# Patient Record
Sex: Female | Born: 1985 | Hispanic: No | Smoking: Never smoker
Health system: Southern US, Community
[De-identification: ages and names within clinical notes are randomized; demographics above are authoritative.]

## PROBLEM LIST (undated history)

## (undated) DIAGNOSIS — N3010 Interstitial cystitis (chronic) without hematuria: Secondary | ICD-10-CM

## (undated) DIAGNOSIS — T7840XA Allergy, unspecified, initial encounter: Secondary | ICD-10-CM

## (undated) DIAGNOSIS — R569 Unspecified convulsions: Secondary | ICD-10-CM

## (undated) DIAGNOSIS — J45909 Unspecified asthma, uncomplicated: Secondary | ICD-10-CM

## (undated) HISTORY — DX: Interstitial cystitis (chronic) without hematuria: N30.10

## (undated) HISTORY — DX: Allergy, unspecified, initial encounter: T78.40XA

## (undated) HISTORY — DX: Unspecified asthma, uncomplicated: J45.909

## (undated) HISTORY — DX: Unspecified convulsions: R56.9

## (undated) HISTORY — PX: WISDOM TOOTH EXTRACTION: SHX21

---
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
## 2004-07-30 ENCOUNTER — Other Ambulatory Visit: Admission: RE | Admit: 2004-07-30 | Discharge: 2004-07-30 | Payer: Self-pay | Admitting: Internal Medicine

## 2005-01-19 ENCOUNTER — Ambulatory Visit: Payer: Self-pay | Admitting: Family Medicine

## 2005-04-11 ENCOUNTER — Emergency Department: Payer: Self-pay | Admitting: Emergency Medicine

## 2005-04-23 ENCOUNTER — Ambulatory Visit: Payer: Self-pay | Admitting: Urology

## 2005-06-17 ENCOUNTER — Ambulatory Visit: Payer: Self-pay | Admitting: Family Medicine

## 2005-06-21 ENCOUNTER — Emergency Department: Payer: Self-pay | Admitting: Emergency Medicine

## 2005-06-23 ENCOUNTER — Emergency Department: Payer: Self-pay | Admitting: Emergency Medicine

## 2005-07-06 ENCOUNTER — Ambulatory Visit: Payer: Self-pay | Admitting: Unknown Physician Specialty

## 2005-07-09 ENCOUNTER — Inpatient Hospital Stay: Payer: Self-pay | Admitting: Unknown Physician Specialty

## 2005-08-26 ENCOUNTER — Ambulatory Visit: Payer: Self-pay | Admitting: Family Medicine

## 2005-11-01 ENCOUNTER — Ambulatory Visit: Payer: Self-pay | Admitting: Family Medicine

## 2005-11-01 ENCOUNTER — Other Ambulatory Visit: Admission: RE | Admit: 2005-11-01 | Discharge: 2005-11-01 | Payer: Self-pay | Admitting: Family Medicine

## 2005-11-29 ENCOUNTER — Ambulatory Visit: Payer: Self-pay | Admitting: Family Medicine

## 2006-01-27 ENCOUNTER — Ambulatory Visit: Payer: Self-pay | Admitting: Family Medicine

## 2008-07-17 ENCOUNTER — Encounter (INDEPENDENT_AMBULATORY_CARE_PROVIDER_SITE_OTHER): Payer: Self-pay | Admitting: Internal Medicine

## 2008-10-14 ENCOUNTER — Ambulatory Visit: Payer: Self-pay | Admitting: Family Medicine

## 2008-11-15 ENCOUNTER — Ambulatory Visit: Payer: Self-pay | Admitting: Family Medicine

## 2008-12-27 HISTORY — PX: LAPAROSCOPY: SHX197

## 2008-12-27 HISTORY — PX: URETEROSCOPY: SHX842

## 2009-02-06 ENCOUNTER — Ambulatory Visit: Payer: Self-pay | Admitting: Unknown Physician Specialty

## 2009-05-13 DIAGNOSIS — N3010 Interstitial cystitis (chronic) without hematuria: Secondary | ICD-10-CM | POA: Insufficient documentation

## 2009-08-26 ENCOUNTER — Emergency Department: Payer: Self-pay | Admitting: Emergency Medicine

## 2009-09-02 ENCOUNTER — Ambulatory Visit: Payer: Self-pay | Admitting: Unknown Physician Specialty

## 2009-09-03 ENCOUNTER — Ambulatory Visit: Payer: Self-pay | Admitting: Unknown Physician Specialty

## 2010-09-04 IMAGING — CR DG CHEST 2V
1 series · 2 of 2 positions shown · non-contrast
Comparison: none

REASON FOR EXAM: cough, influenza
COMMENTS:

PROCEDURE:     KDR - KDXR CHEST PA (OR AP) AND LAT  - October 14, 2008 [DATE]
RESULT:     The lung fields are clear. No pneumonia, pneumothorax or pleural
effusion is seen. The heart, mediastinal and osseous structures show no
significant abnormalities.

[Series 1: view not recorded · 0.17mm/px · 2 of 2 slices shown]
[im 1/2]
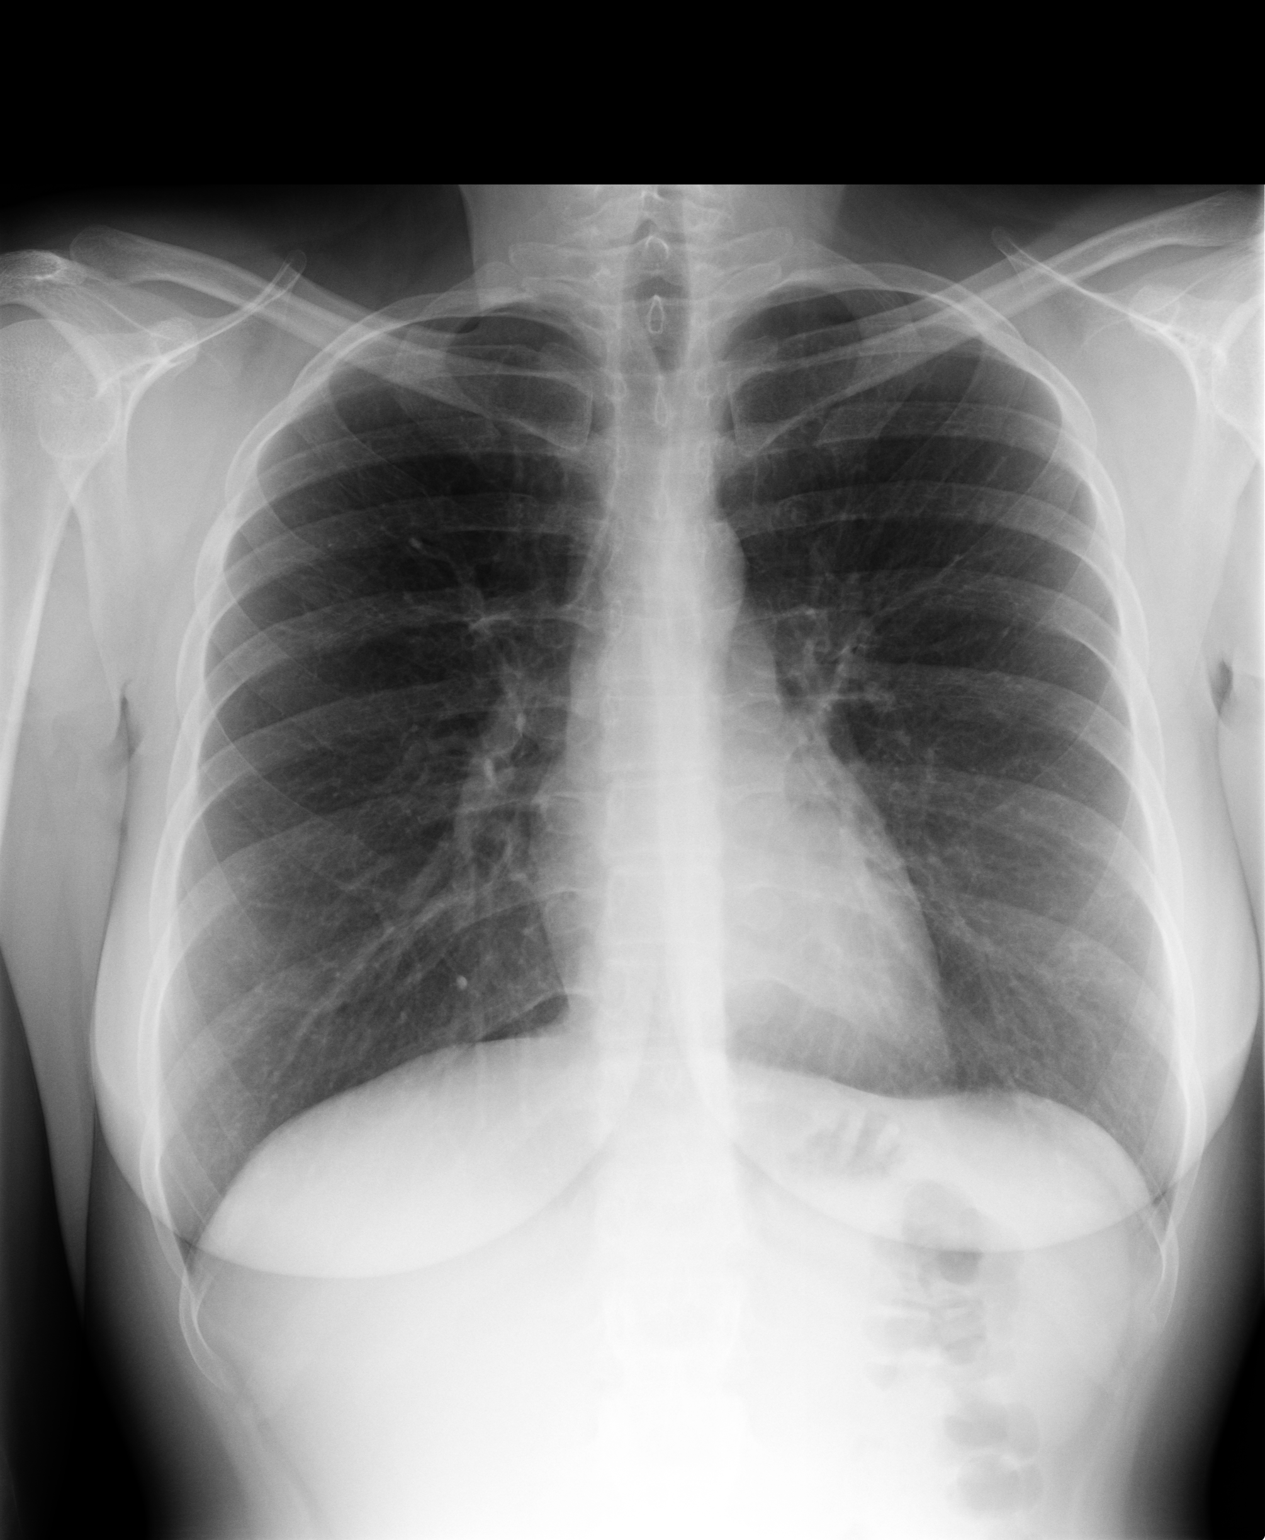
[im 2/2]
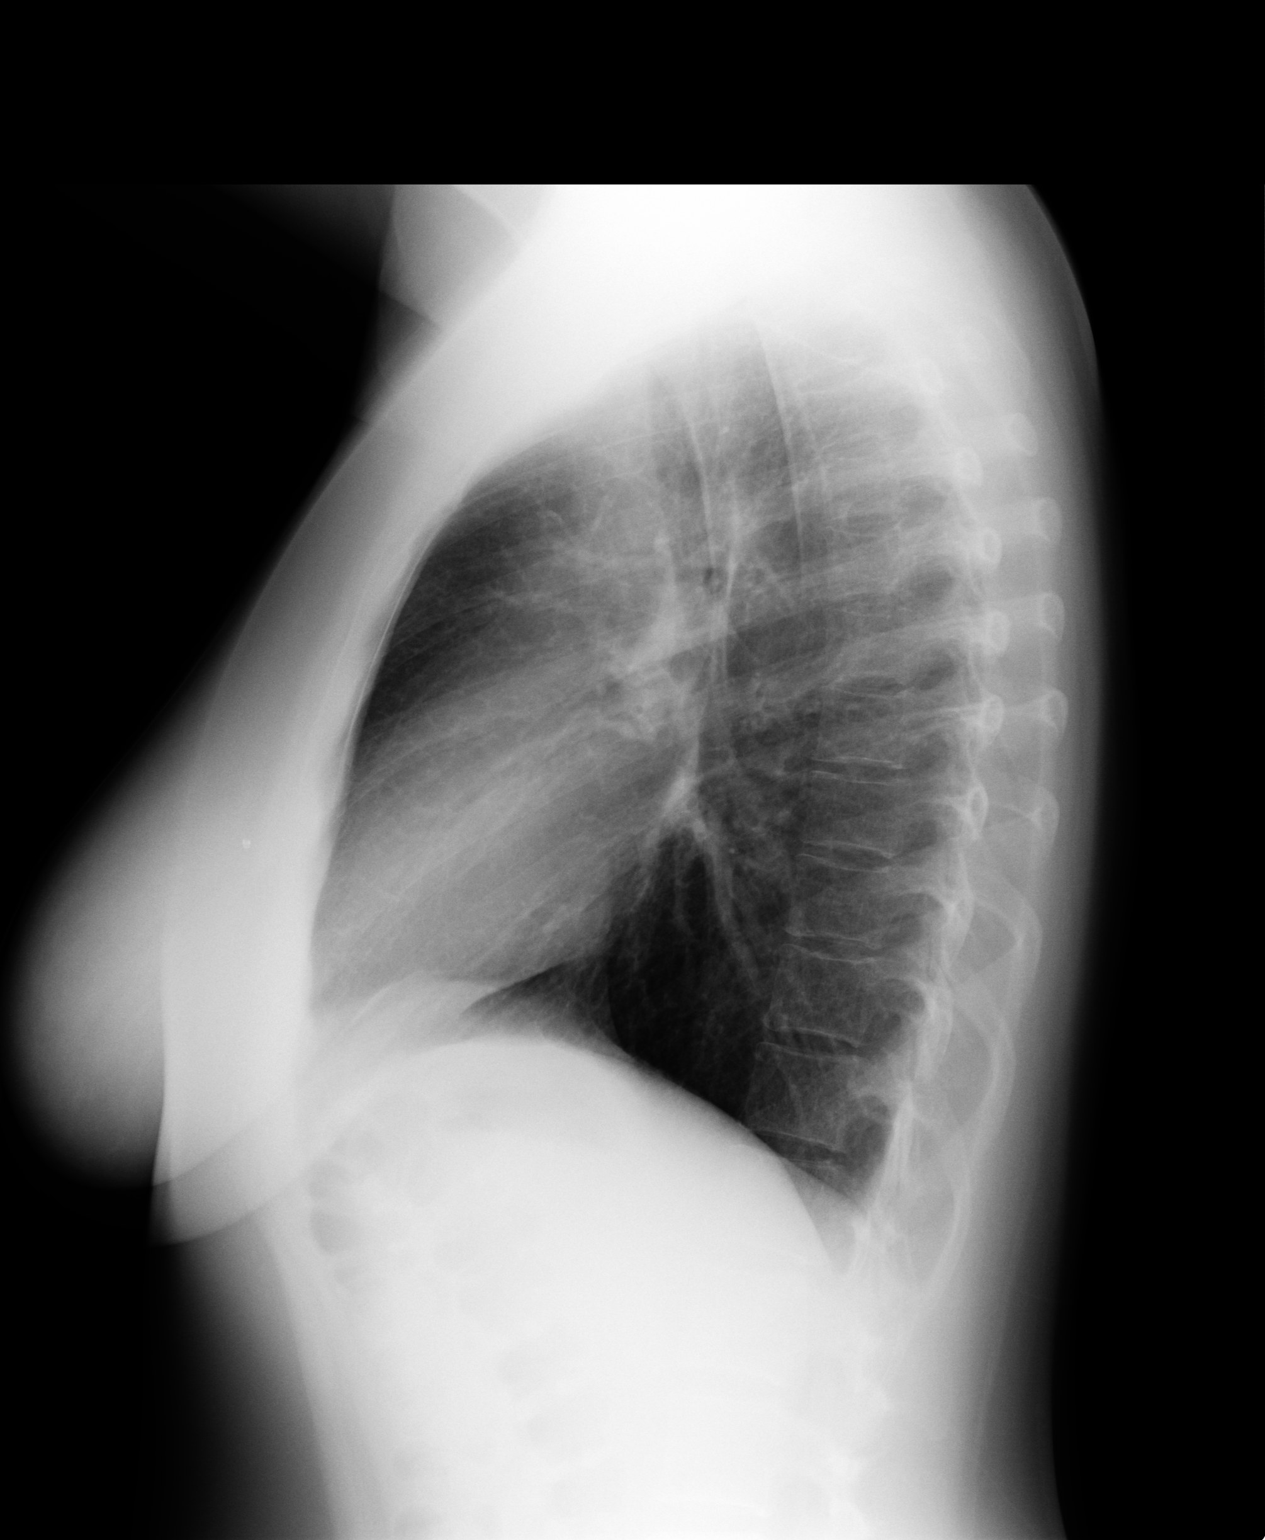

[2 of 2 positions shown; findings below may reference images not displayed]

IMPRESSION: No significant abnormalities are noted.

## 2011-07-17 IMAGING — CT CT ABD-PELV W/O CM
1 of 2 series · 15 of 32 positions shown, 19 images · non-contrast
Comparison: none

REASON FOR EXAM: (1) L flank and LLQ pain; (2) L falnk and LLQ pain,
stone protocol
COMMENTS:

[Series 2: abdomen · axial · 0.67mm/px · z∈[-362,+24]mm · 15 of 85 slices shown, 19 images]
[im 4/85  soft-tissue]
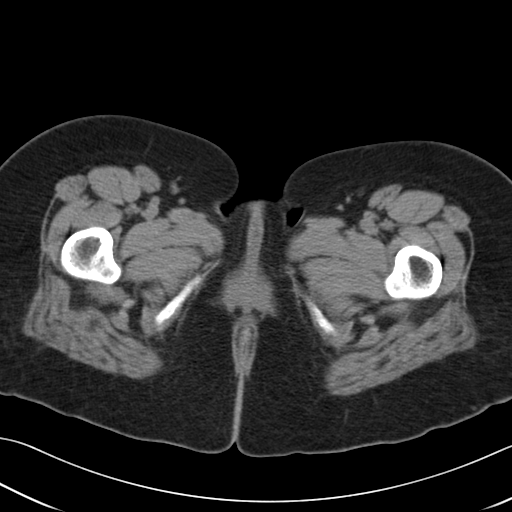
[im 4/85  bone]
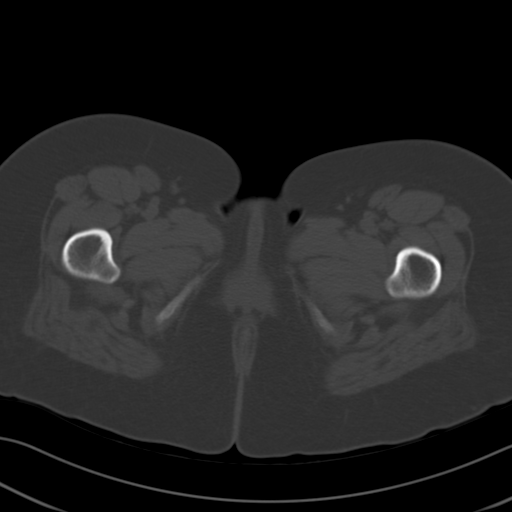
[im 10/85  soft-tissue]
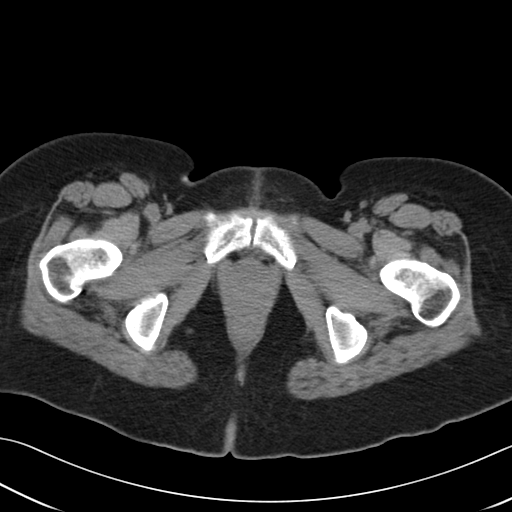
[im 17/85  soft-tissue]
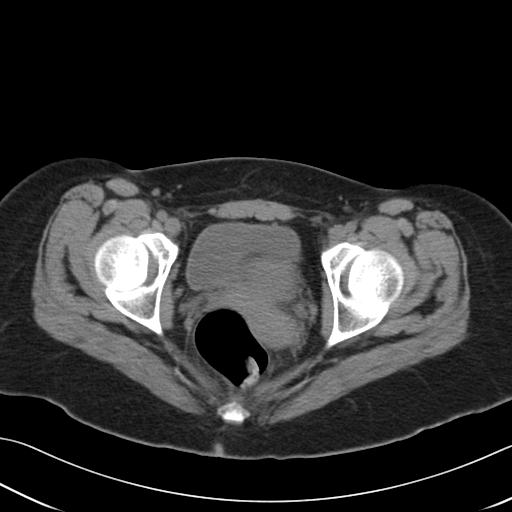
[im 23/85  soft-tissue]
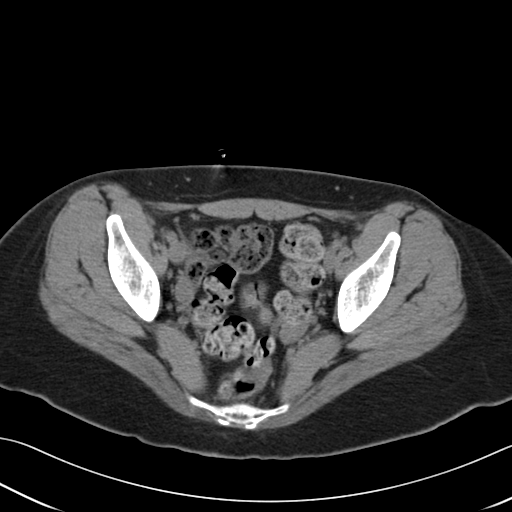
[im 30/85  soft-tissue]
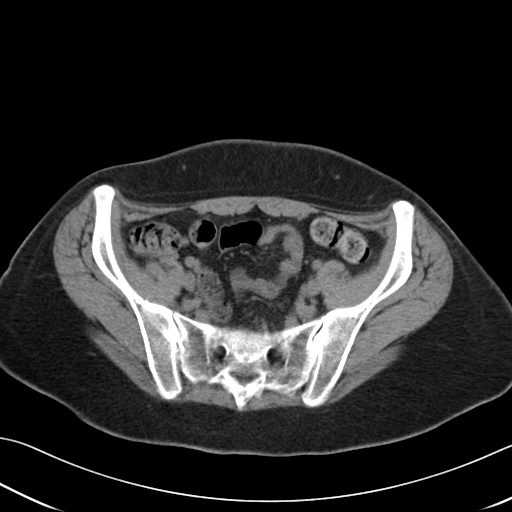
[im 36/85  soft-tissue]
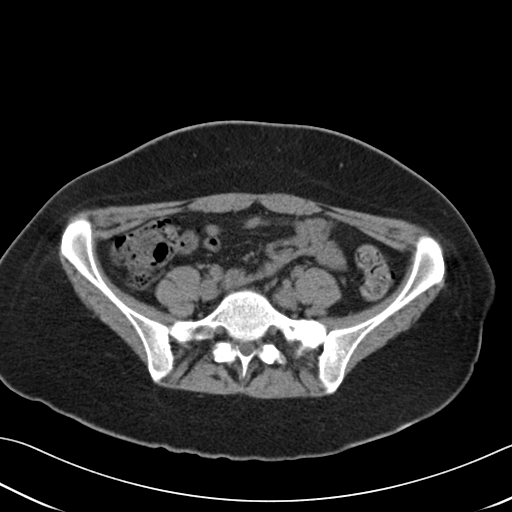
[im 43/85  soft-tissue]
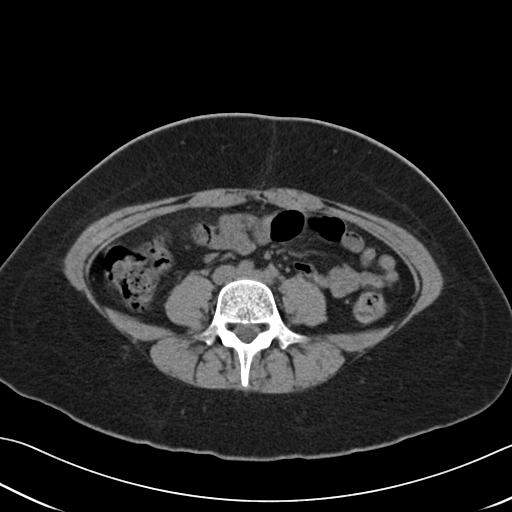
[im 49/85  soft-tissue]
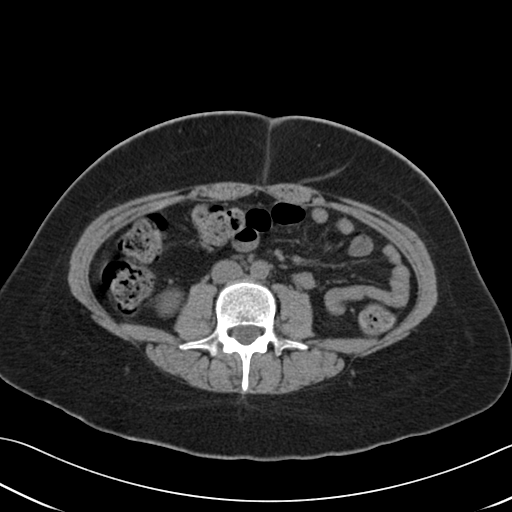
[im 55/85  soft-tissue]
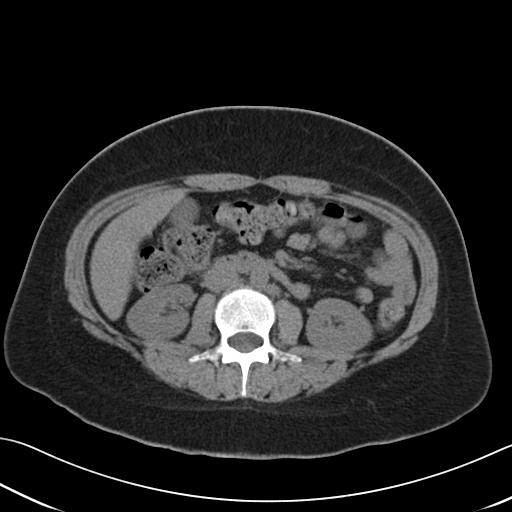
[im 55/85  bone]
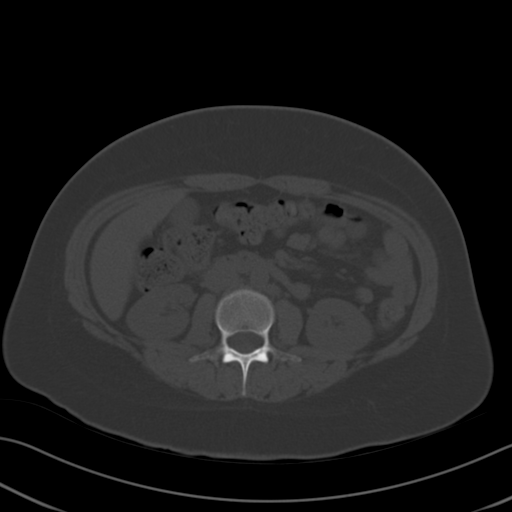
[im 62/85  soft-tissue]
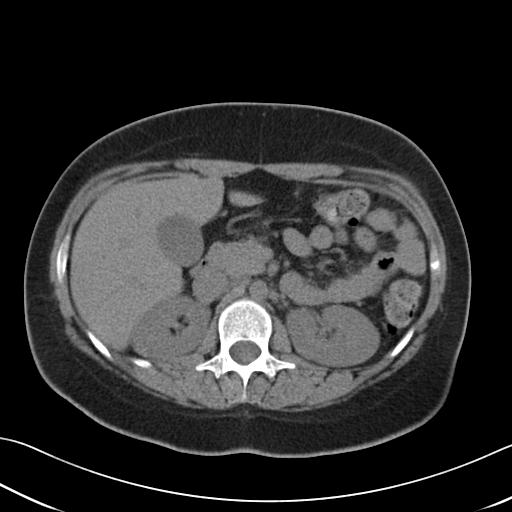
[im 68/85  soft-tissue]
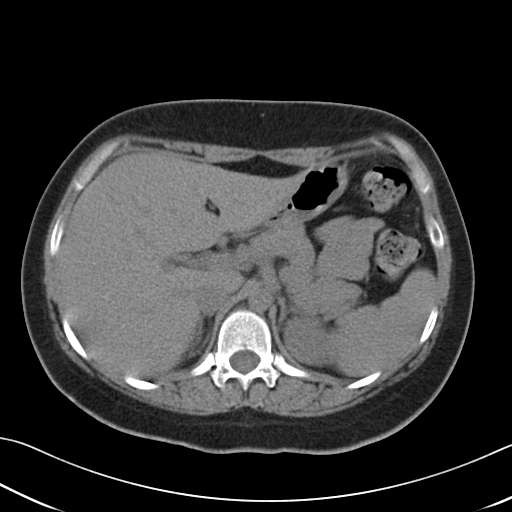
[im 72/85  lung]
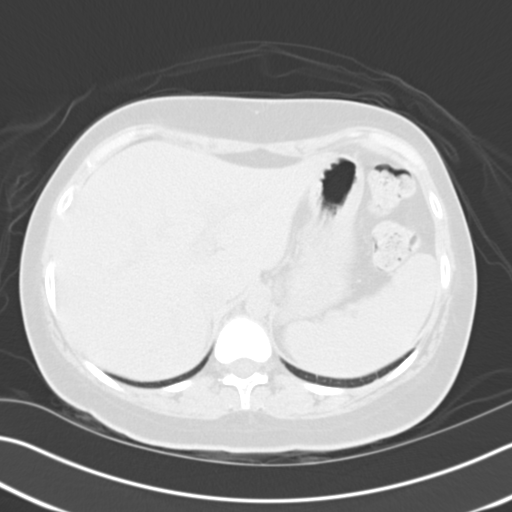
[im 75/85  soft-tissue]
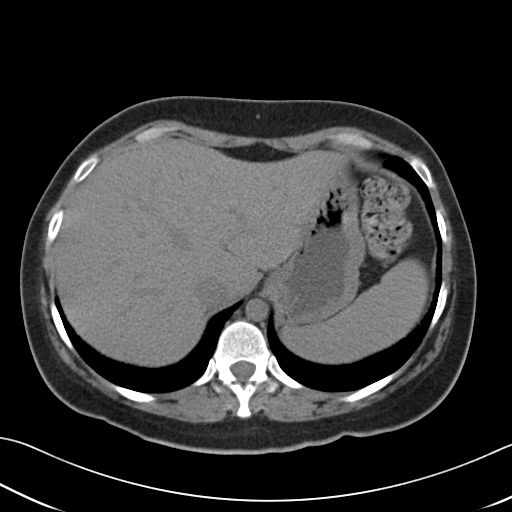
[im 75/85  lung]
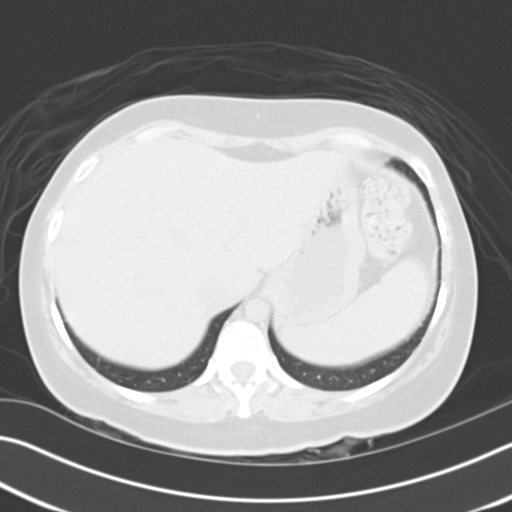
[im 78/85  lung]
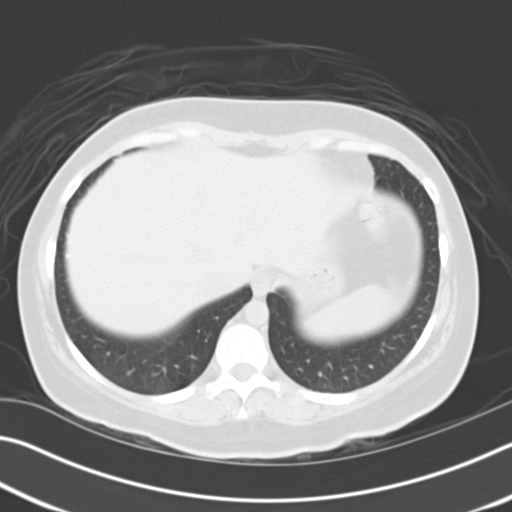
[im 81/85  soft-tissue]
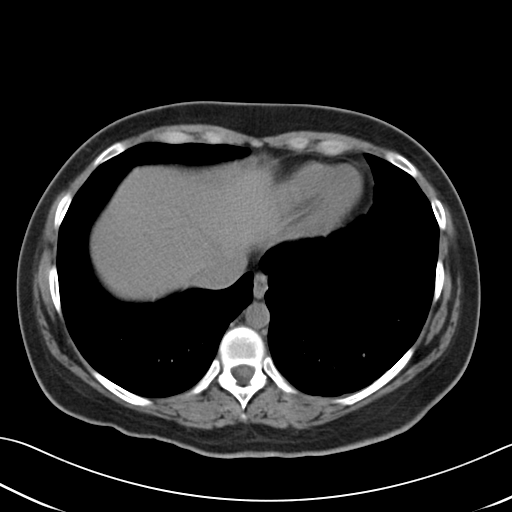
[im 81/85  lung]
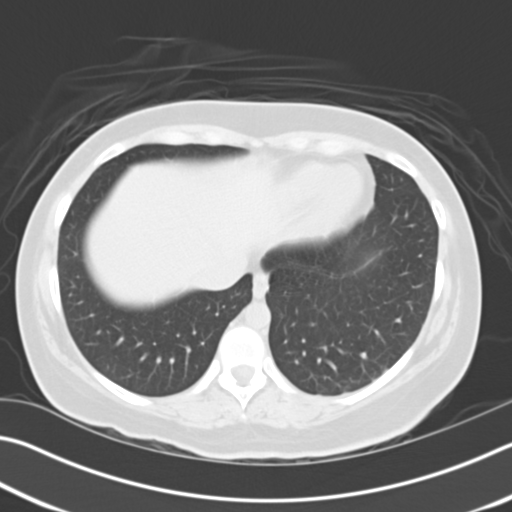

[15 of 32 positions shown; findings below may reference images not displayed]

PROCEDURE:     CT  - CT ABDOMEN AND PELVIS W[DATE]  [DATE]

RESULT:     Noncontrast CT of the abdomen and pelvis is performed utilizing
a renal stone protocol. Images are reconstructed in the axial plane at 3 mm
slice thickness. Comparison is made to the study performed on 02/06/2009.

The lung bases are clear. There is no urolithiasis. There is no urinary
obstruction. The abdominal viscera appear to be within normal limits for a
noncontrast exam. There is no adenopathy or inflammation. Urinary bladder
and uterus appear to be unremarkable. There is no free fluid or abnormal
fluid collection. The bony structures are unremarkable.
IMPRESSION: 1. No acute abnormality. No urinary obstruction or urinary calculi evident.
There is no evidence of an acute inflammatory process. There is no CT
evidence of diverticulitis or appendicitis.

## 2012-04-24 ENCOUNTER — Encounter: Payer: Self-pay | Admitting: Obstetrics & Gynecology

## 2012-04-24 ENCOUNTER — Ambulatory Visit (INDEPENDENT_AMBULATORY_CARE_PROVIDER_SITE_OTHER): Payer: 59 | Admitting: Obstetrics & Gynecology

## 2012-04-24 VITALS — BP 112/69 | HR 61 | Ht 61.0 in | Wt 167.0 lb

## 2012-04-24 DIAGNOSIS — Z3041 Encounter for surveillance of contraceptive pills: Secondary | ICD-10-CM

## 2012-04-24 DIAGNOSIS — Z01419 Encounter for gynecological examination (general) (routine) without abnormal findings: Secondary | ICD-10-CM

## 2012-04-24 DIAGNOSIS — Z124 Encounter for screening for malignant neoplasm of cervix: Secondary | ICD-10-CM

## 2012-04-24 DIAGNOSIS — Z113 Encounter for screening for infections with a predominantly sexual mode of transmission: Secondary | ICD-10-CM

## 2012-04-24 MED ORDER — NORETHIN ACE-ETH ESTRAD-FE 1.5-30 MG-MCG PO TABS: 1.0000 | ORAL_TABLET | Freq: Every day | ORAL | Status: DC

## 2012-04-24 NOTE — Patient Instructions (Signed)
Preventive Care for Adults, Female A healthy lifestyle and preventive care can promote health and wellness. Preventive health guidelines for women include the following key practices.  A routine yearly physical is a good way to check with your caregiver about your health and preventive screening. It is a chance to share any concerns and updates on your health, and to receive a thorough exam.   Visit your dentist for a routine exam and preventive care every 6 months. Brush your teeth twice a day and floss once a day. Good oral hygiene prevents tooth decay and gum disease.   The frequency of eye exams is based on your age, health, family medical history, use of contact lenses, and other factors. Follow your caregiver's recommendations for frequency of eye exams.   Eat a healthy diet. Foods like vegetables, fruits, whole grains, low-fat dairy products, and lean protein foods contain the nutrients you need without too many calories. Decrease your intake of foods high in solid fats, added sugars, and salt. Eat the right amount of calories for you.Get information about a proper diet from your caregiver, if necessary.   Regular physical exercise is one of the most important things you can do for your health. Most adults should get at least 150 minutes of moderate-intensity exercise (any activity that increases your heart rate and causes you to sweat) each week. In addition, most adults need muscle-strengthening exercises on 2 or more days a week.   Maintain a healthy weight. The body mass index (BMI) is a screening tool to identify possible weight problems. It provides an estimate of body fat based on height and weight. Your caregiver can help determine your BMI, and can help you achieve or maintain a healthy weight.For adults 20 years and older:   A BMI below 18.5 is considered underweight.   A BMI of 18.5 to 24.9 is normal.   A BMI of 25 to 29.9 is considered overweight.   A BMI of 30 and above is  considered obese.   Maintain normal blood lipids and cholesterol levels by exercising and minimizing your intake of saturated fat. Eat a balanced diet with plenty of fruit and vegetables. Blood tests for lipids and cholesterol should begin at age 20 and be repeated every 5 years. If your lipid or cholesterol levels are high, you are over 50, or you are at high risk for heart disease, you may need your cholesterol levels checked more frequently.Ongoing high lipid and cholesterol levels should be treated with medicines if diet and exercise are not effective.   If you smoke, find out from your caregiver how to quit. If you do not use tobacco, do not start.   If you are pregnant, do not drink alcohol. If you are breastfeeding, be very cautious about drinking alcohol. If you are not pregnant and choose to drink alcohol, do not exceed 1 drink per day. One drink is considered to be 12 ounces (355 mL) of beer, 5 ounces (148 mL) of wine, or 1.5 ounces (44 mL) of liquor.   Avoid use of street drugs. Do not share needles with anyone. Ask for help if you need support or instructions about stopping the use of drugs.   High blood pressure causes heart disease and increases the risk of stroke. Your blood pressure should be checked at least every 1 to 2 years. Ongoing high blood pressure should be treated with medicines if weight loss and exercise are not effective.   If you are 55 to 27   years old, ask your caregiver if you should take aspirin to prevent strokes.   Diabetes screening involves taking a blood sample to check your fasting blood sugar level. This should be done once every 3 years, after age 45, if you are within normal weight and without risk factors for diabetes. Testing should be considered at a younger age or be carried out more frequently if you are overweight and have at least 1 risk factor for diabetes.   Breast cancer screening is essential preventive care for women. You should practice "breast  self-awareness." This means understanding the normal appearance and feel of your breasts and may include breast self-examination. Any changes detected, no matter how small, should be reported to a caregiver. Women in their 20s and 30s should have a clinical breast exam (CBE) by a caregiver as part of a regular health exam every 1 to 3 years. After age 40, women should have a CBE every year. Starting at age 40, women should consider having a mammography (breast X-ray test) every year. Women who have a family history of breast cancer should talk to their caregiver about genetic screening. Women at a high risk of breast cancer should talk to their caregivers about having magnetic resonance imaging (MRI) and a mammography every year.   The Pap test is a screening test for cervical cancer. A Pap test can show cell changes on the cervix that might become cervical cancer if left untreated. A Pap test is a procedure in which cells are obtained and examined from the lower end of the uterus (cervix).   Women should have a Pap test starting at age 21.   Between ages 21 and 29, Pap tests should be repeated every 2 years.   Beginning at age 30, you should have a Pap test every 3 years as long as the past 3 Pap tests have been normal.   Some women have medical problems that increase the chance of getting cervical cancer. Talk to your caregiver about these problems. It is especially important to talk to your caregiver if a new problem develops soon after your last Pap test. In these cases, your caregiver may recommend more frequent screening and Pap tests.   The above recommendations are the same for women who have or have not gotten the vaccine for human papillomavirus (HPV).   If you had a hysterectomy for a problem that was not cancer or a condition that could lead to cancer, then you no longer need Pap tests. Even if you no longer need a Pap test, a regular exam is a good idea to make sure no other problems are  starting.   If you are between ages 65 and 70, and you have had normal Pap tests going back 10 years, you no longer need Pap tests. Even if you no longer need a Pap test, a regular exam is a good idea to make sure no other problems are starting.   If you have had past treatment for cervical cancer or a condition that could lead to cancer, you need Pap tests and screening for cancer for at least 20 years after your treatment.   If Pap tests have been discontinued, risk factors (such as a new sexual partner) need to be reassessed to determine if screening should be resumed.   The HPV test is an additional test that may be used for cervical cancer screening. The HPV test looks for the virus that can cause the cell changes on the cervix.   The cells collected during the Pap test can be tested for HPV. The HPV test could be used to screen women aged 30 years and older, and should be used in women of any age who have unclear Pap test results. After the age of 30, women should have HPV testing at the same frequency as a Pap test.   Colorectal cancer can be detected and often prevented. Most routine colorectal cancer screening begins at the age of 50 and continues through age 75. However, your caregiver may recommend screening at an earlier age if you have risk factors for colon cancer. On a yearly basis, your caregiver may provide home test kits to check for hidden blood in the stool. Use of a small camera at the end of a tube, to directly examine the colon (sigmoidoscopy or colonoscopy), can detect the earliest forms of colorectal cancer. Talk to your caregiver about this at age 50, when routine screening begins. Direct examination of the colon should be repeated every 5 to 10 years through age 75, unless early forms of pre-cancerous polyps or small growths are found.   Hepatitis C blood testing is recommended for all people born from 1945 through 1965 and any individual with known risks for hepatitis C.    Practice safe sex. Use condoms and avoid high-risk sexual practices to reduce the spread of sexually transmitted infections (STIs). STIs include gonorrhea, chlamydia, syphilis, trichomonas, herpes, HPV, and human immunodeficiency virus (HIV). Herpes, HIV, and HPV are viral illnesses that have no cure. They can result in disability, cancer, and death. Sexually active women aged 25 and younger should be checked for chlamydia. Older women with new or multiple partners should also be tested for chlamydia. Testing for other STIs is recommended if you are sexually active and at increased risk.   Osteoporosis is a disease in which the bones lose minerals and strength with aging. This can result in serious bone fractures. The risk of osteoporosis can be identified using a bone density scan. Women ages 65 and over and women at risk for fractures or osteoporosis should discuss screening with their caregivers. Ask your caregiver whether you should take a calcium supplement or vitamin D to reduce the rate of osteoporosis.   Menopause can be associated with physical symptoms and risks. Hormone replacement therapy is available to decrease symptoms and risks. You should talk to your caregiver about whether hormone replacement therapy is right for you.   Use sunscreen with sun protection factor (SPF) of 30 or more. Apply sunscreen liberally and repeatedly throughout the day. You should seek shade when your shadow is shorter than you. Protect yourself by wearing long sleeves, pants, a wide-brimmed hat, and sunglasses year round, whenever you are outdoors.   Once a month, do a whole body skin exam, using a mirror to look at the skin on your back. Notify your caregiver of new moles, moles that have irregular borders, moles that are larger than a pencil eraser, or moles that have changed in shape or color.   Stay current with required immunizations.   Influenza. You need a dose every fall (or winter). The composition of  the flu vaccine changes each year, so being vaccinated once is not enough.   Pneumococcal polysaccharide. You need 1 to 2 doses if you smoke cigarettes or if you have certain chronic medical conditions. You need 1 dose at age 65 (or older) if you have never been vaccinated.   Tetanus, diphtheria, pertussis (Tdap, Td). Get 1 dose of   Tdap vaccine if you are younger than age 65, are over 65 and have contact with an infant, are a healthcare worker, are pregnant, or simply want to be protected from whooping cough. After that, you need a Td booster dose every 10 years. Consult your caregiver if you have not had at least 3 tetanus and diphtheria-containing shots sometime in your life or have a deep or dirty wound.   HPV. You need this vaccine if you are a woman age 26 or younger. The vaccine is given in 3 doses over 6 months.   Measles, mumps, rubella (MMR). You need at least 1 dose of MMR if you were born in 1957 or later. You may also need a second dose.   Meningococcal. If you are age 19 to 21 and a first-year college student living in a residence hall, or have one of several medical conditions, you need to get vaccinated against meningococcal disease. You may also need additional booster doses.   Zoster (shingles). If you are age 60 or older, you should get this vaccine.   Varicella (chickenpox). If you have never had chickenpox or you were vaccinated but received only 1 dose, talk to your caregiver to find out if you need this vaccine.   Hepatitis A. You need this vaccine if you have a specific risk factor for hepatitis A virus infection or you simply wish to be protected from this disease. The vaccine is usually given as 2 doses, 6 to 18 months apart.   Hepatitis B. You need this vaccine if you have a specific risk factor for hepatitis B virus infection or you simply wish to be protected from this disease. The vaccine is given in 3 doses, usually over 6 months.  Preventive Services /  Frequency Ages 19 to 39  Blood pressure check.** / Every 1 to 2 years.   Lipid and cholesterol check.** / Every 5 years beginning at age 20.   Clinical breast exam.** / Every 3 years for women in their 20s and 30s.   Pap test.** / Every 2 years from ages 21 through 29. Every 3 years starting at age 30 through age 65 or 70 with a history of 3 consecutive normal Pap tests.   HPV screening.** / Every 3 years from ages 30 through ages 65 to 70 with a history of 3 consecutive normal Pap tests.   Hepatitis C blood test.** / For any individual with known risks for hepatitis C.   Skin self-exam. / Monthly.   Influenza immunization.** / Every year.   Pneumococcal polysaccharide immunization.** / 1 to 2 doses if you smoke cigarettes or if you have certain chronic medical conditions.   Tetanus, diphtheria, pertussis (Tdap, Td) immunization. / A one-time dose of Tdap vaccine. After that, you need a Td booster dose every 10 years.   HPV immunization. / 3 doses over 6 months, if you are 26 and younger.   Measles, mumps, rubella (MMR) immunization. / You need at least 1 dose of MMR if you were born in 1957 or later. You may also need a second dose.   Meningococcal immunization. / 1 dose if you are age 19 to 21 and a first-year college student living in a residence hall, or have one of several medical conditions, you need to get vaccinated against meningococcal disease. You may also need additional booster doses.   Varicella immunization.** / Consult your caregiver.   Hepatitis A immunization.** / Consult your caregiver. 2 doses, 6 to 18 months   apart.   Hepatitis B immunization.** / Consult your caregiver. 3 doses usually over 6 months.  Ages 40 to 64  Blood pressure check.** / Every 1 to 2 years.   Lipid and cholesterol check.** / Every 5 years beginning at age 20.   Clinical breast exam.** / Every year after age 40.   Mammogram.** / Every year beginning at age 40 and continuing for as  long as you are in good health. Consult with your caregiver.   Pap test.** / Every 3 years starting at age 30 through age 65 or 70 with a history of 3 consecutive normal Pap tests.   HPV screening.** / Every 3 years from ages 30 through ages 65 to 70 with a history of 3 consecutive normal Pap tests.   Fecal occult blood test (FOBT) of stool. / Every year beginning at age 50 and continuing until age 75. You may not need to do this test if you get a colonoscopy every 10 years.   Flexible sigmoidoscopy or colonoscopy.** / Every 5 years for a flexible sigmoidoscopy or every 10 years for a colonoscopy beginning at age 50 and continuing until age 75.   Hepatitis C blood test.** / For all people born from 1945 through 1965 and any individual with known risks for hepatitis C.   Skin self-exam. / Monthly.   Influenza immunization.** / Every year.   Pneumococcal polysaccharide immunization.** / 1 to 2 doses if you smoke cigarettes or if you have certain chronic medical conditions.   Tetanus, diphtheria, pertussis (Tdap, Td) immunization.** / A one-time dose of Tdap vaccine. After that, you need a Td booster dose every 10 years.   Measles, mumps, rubella (MMR) immunization. / You need at least 1 dose of MMR if you were born in 1957 or later. You may also need a second dose.   Varicella immunization.** / Consult your caregiver.   Meningococcal immunization.** / Consult your caregiver.   Hepatitis A immunization.** / Consult your caregiver. 2 doses, 6 to 18 months apart.   Hepatitis B immunization.** / Consult your caregiver. 3 doses, usually over 6 months.  Ages 65 and over  Blood pressure check.** / Every 1 to 2 years.   Lipid and cholesterol check.** / Every 5 years beginning at age 20.   Clinical breast exam.** / Every year after age 40.   Mammogram.** / Every year beginning at age 40 and continuing for as long as you are in good health. Consult with your caregiver.   Pap test.** /  Every 3 years starting at age 30 through age 65 or 70 with a 3 consecutive normal Pap tests. Testing can be stopped between 65 and 70 with 3 consecutive normal Pap tests and no abnormal Pap or HPV tests in the past 10 years.   HPV screening.** / Every 3 years from ages 30 through ages 65 or 70 with a history of 3 consecutive normal Pap tests. Testing can be stopped between 65 and 70 with 3 consecutive normal Pap tests and no abnormal Pap or HPV tests in the past 10 years.   Fecal occult blood test (FOBT) of stool. / Every year beginning at age 50 and continuing until age 75. You may not need to do this test if you get a colonoscopy every 10 years.   Flexible sigmoidoscopy or colonoscopy.** / Every 5 years for a flexible sigmoidoscopy or every 10 years for a colonoscopy beginning at age 50 and continuing until age 75.   Hepatitis   C blood test.** / For all people born from 1945 through 1965 and any individual with known risks for hepatitis C.   Osteoporosis screening.** / A one-time screening for women ages 65 and over and women at risk for fractures or osteoporosis.   Skin self-exam. / Monthly.   Influenza immunization.** / Every year.   Pneumococcal polysaccharide immunization.** / 1 dose at age 65 (or older) if you have never been vaccinated.   Tetanus, diphtheria, pertussis (Tdap, Td) immunization. / A one-time dose of Tdap vaccine if you are over 65 and have contact with an infant, are a healthcare worker, or simply want to be protected from whooping cough. After that, you need a Td booster dose every 10 years.   Varicella immunization.** / Consult your caregiver.   Meningococcal immunization.** / Consult your caregiver.   Hepatitis A immunization.** / Consult your caregiver. 2 doses, 6 to 18 months apart.   Hepatitis B immunization.** / Check with your caregiver. 3 doses, usually over 6 months.  ** Family history and personal history of risk and conditions may change your caregiver's  recommendations. Document Released: 02/08/2002 Document Revised: 12/02/2011 Document Reviewed: 05/10/2011 [employer] Patient Information 2012 [employer]. 

## 2012-04-24 NOTE — Progress Notes (Signed)
  Subjective:     Pamela Dillon is a 26 y.o. G0P0 female and is here for annual gynecologic exam. The patient reports no problems. 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 No other problems.  History   Social History  . Marital Status: Single    Spouse Name: N/A    Number of Children: N/A  . Years of Education: N/A   Occupational History  . Not on file.   Social History Main Topics  . Smoking status: Never Smoker   . Smokeless tobacco: Not on file  . Alcohol Use: Yes     occassionally  . Drug Use: No  . Sexually Active: Yes -- Female partner(s)    Birth Control/ Protection: Pill   Other Topics Concern  . Not on file   Social History Narrative  . No narrative on file   Health Maintenance  Topic Date Due  . Pap Smear  02/09/2003  . Tetanus/tdap  02/10/2004  . Influenza Vaccine  09/26/2012   The following portions of the patient's history were reviewed and updated as appropriate: allergies, current medications, past family history, past medical history, past social history, past surgical history and problem list.  Review of Systems A comprehensive review of systems was negative.   Objective:  Blood pressure 112/69, pulse 61, height 5\' 1"  (1.549 m), weight 167 lb (75.751 kg), last menstrual period 03/27/2012. GENERAL: Well-developed, well-nourished female in no acute distress.  HEENT: Normocephalic, atraumatic. Sclerae anicteric.  NECK: Supple. Normal thyroid.  LUNGS: Clear to auscultation bilaterally.  HEART: Regular rate and rhythm. BREASTS: Symmetric with everted nipples. No masses, skin changes, nipple drainage, or lymphadenopathy. ABDOMEN: Soft, nontender, nondistended. No organomegaly. PELVIC: Normal external female genitalia. Vagina is pink and rugated.  Normal discharge. Normal cervix contour. Pap smear obtained. Uterus is normal in size. No adnexal mass or tenderness.  EXTREMITIES: No cyanosis, clubbing,  or edema, 2+ distal pulses.   Assessment:    Healthy female exam.      Plan:    Pap done, will follow up results OCPs refilled Patient is on medication for Elmiron for interstitial cystitis; will call us with dosage and we will call in a refill

## 2012-05-04 ENCOUNTER — Other Ambulatory Visit: Payer: Self-pay | Admitting: Sports Medicine

## 2012-05-04 DIAGNOSIS — M25539 Pain in unspecified wrist: Secondary | ICD-10-CM

## 2012-05-04 DIAGNOSIS — M25531 Pain in right wrist: Secondary | ICD-10-CM

## 2012-05-10 ENCOUNTER — Inpatient Hospital Stay: Admission: RE | Admit: 2012-05-10 | Payer: 59 | Source: Ambulatory Visit

## 2012-05-10 ENCOUNTER — Other Ambulatory Visit: Payer: 59

## 2012-05-14 ENCOUNTER — Ambulatory Visit
Admission: RE | Admit: 2012-05-14 | Discharge: 2012-05-14 | Disposition: A | Discharge status: Home / Self Care | Payer: 59 | Source: Ambulatory Visit | Attending: Sports Medicine | Admitting: Sports Medicine

## 2012-05-14 DIAGNOSIS — M25531 Pain in right wrist: Secondary | ICD-10-CM

## 2012-05-18 ENCOUNTER — Other Ambulatory Visit: Payer: 59

## 2015-05-01 ENCOUNTER — Other Ambulatory Visit: Payer: Self-pay | Admitting: *Deleted

## 2015-05-15 ENCOUNTER — Other Ambulatory Visit: Payer: Self-pay | Admitting: Internal Medicine

## 2015-05-15 ENCOUNTER — Encounter: Payer: Self-pay | Admitting: Internal Medicine

## 2015-05-15 ENCOUNTER — Ambulatory Visit (INDEPENDENT_AMBULATORY_CARE_PROVIDER_SITE_OTHER): Payer: BLUE CROSS/BLUE SHIELD | Admitting: Internal Medicine

## 2015-05-15 VITALS — BP 110/70 | HR 66 | Temp 98.1°F | Ht 61.0 in | Wt 179.0 lb

## 2015-05-15 DIAGNOSIS — J45909 Unspecified asthma, uncomplicated: Secondary | ICD-10-CM | POA: Insufficient documentation

## 2015-05-15 DIAGNOSIS — J4540 Moderate persistent asthma, uncomplicated: Secondary | ICD-10-CM

## 2015-05-15 DIAGNOSIS — R0602 Shortness of breath: Secondary | ICD-10-CM

## 2015-05-15 DIAGNOSIS — R0600 Dyspnea, unspecified: Secondary | ICD-10-CM

## 2015-05-15 NOTE — Patient Instructions (Addendum)
Follow up with Dr. Stevenson Clinch in 1 month - methacholine challenge test, pulmonary function testing and 6 minute walk test at [facility] prior to follow up visit - cont with Symbicort - 2 puff in the AM and PM - gargle and rinse each use - albuterol inhaler - 2puff every 3-4 hours as needed for shortness of breath\wheezing\recurrent cough - possible triggers of your asthma - rapid weather changes, dust, pollen, saw dust, etc. - try at best to avoid triggers, wear a mask while at work.   Asthma Asthma is a recurring condition in which the airways tighten and narrow. Asthma can make it difficult to breathe. It can cause coughing, wheezing, and shortness of breath. Asthma episodes, also called asthma attacks, range from minor to life-threatening. Asthma cannot be cured, but medicines and lifestyle changes can help control it. CAUSES Asthma is believed to be caused by inherited (genetic) and environmental factors, but its exact cause is unknown. Asthma may be triggered by allergens, lung infections, or irritants in the air. Asthma triggers are different for each person. Common triggers include:   Animal dander.  Dust mites.  Cockroaches.  Pollen from trees or grass.  Mold.  Smoke.  Air pollutants such as dust, household cleaners, hair sprays, aerosol sprays, paint fumes, strong chemicals, or strong odors.  Cold air, weather changes, and winds (which increase molds and pollens in the air).  Strong emotional expressions such as crying or laughing hard.  Stress.  Certain medicines (such as aspirin) or types of drugs (such as beta-blockers).  Sulfites in foods and drinks. Foods and drinks that may contain sulfites include dried fruit, potato chips, and sparkling grape juice.  Infections or inflammatory conditions such as the flu, a cold, or an inflammation of the nasal membranes (rhinitis).  Gastroesophageal reflux disease (GERD).  Exercise or strenuous activity. SYMPTOMS Symptoms may occur  immediately after asthma is triggered or many hours later. Symptoms include:  Wheezing.  Excessive nighttime or early morning coughing.  Frequent or severe coughing with a common cold.  Chest tightness.  Shortness of breath. DIAGNOSIS  The diagnosis of asthma is made by a review of your medical history and a physical exam. Tests may also be performed. These may include:  Lung function studies. These tests show how much air you breathe in and out.  Allergy tests.  Imaging tests such as X-rays. TREATMENT  Asthma cannot be cured, but it can usually be controlled. Treatment involves identifying and avoiding your asthma triggers. It also involves medicines. There are 2 classes of medicine used for asthma treatment:   Controller medicines. These prevent asthma symptoms from occurring. They are usually taken every day.  Reliever or rescue medicines. These quickly relieve asthma symptoms. They are used as needed and provide short-term relief. Your health care provider will help you create an asthma action plan. An asthma action plan is a written plan for managing and treating your asthma attacks. It includes a list of your asthma triggers and how they may be avoided. It also includes information on when medicines should be taken and when their dosage should be changed. An action plan may also involve the use of a device called a peak flow meter. A peak flow meter measures how well the lungs are working. It helps you monitor your condition. HOME CARE INSTRUCTIONS   Take medicines only as directed by your health care provider. Speak with your health care provider if you have questions about how or when to take the medicines.  Use a peak flow meter as directed by your health care provider. Record and keep track of readings.  Understand and use the action plan to help minimize or stop an asthma attack without needing to seek medical care.  Control your home environment in the following ways to  help prevent asthma attacks:  Do not smoke. Avoid being exposed to secondhand smoke.  Change your heating and air conditioning filter regularly.  Limit your use of fireplaces and wood stoves.  Get rid of pests (such as roaches and mice) and their droppings.  Throw away plants if you see mold on them.  Clean your floors and dust regularly. Use unscented cleaning products.  Try to have someone else vacuum for you regularly. Stay out of rooms while they are being vacuumed and for a short while afterward. If you vacuum, use a dust mask from a hardware store, a double-layered or microfilter vacuum cleaner bag, or a vacuum cleaner with a HEPA filter.  Replace carpet with wood, tile, or vinyl flooring. Carpet can trap dander and dust.  Use allergy-proof pillows, mattress covers, and box spring covers.  Wash bed sheets and blankets every week in hot water and dry them in a dryer.  Use blankets that are made of polyester or cotton.  Clean bathrooms and kitchens with bleach. If possible, have someone repaint the walls in these rooms with mold-resistant paint. Keep out of the rooms that are being cleaned and painted.  Wash hands frequently. SEEK MEDICAL CARE IF:   You have wheezing, shortness of breath, or a cough even if taking medicine to prevent attacks.  The colored mucus you cough up (sputum) is thicker than usual.  Your sputum changes from clear or white to yellow, green, gray, or bloody.  You have any problems that may be related to the medicines you are taking (such as a rash, itching, swelling, or trouble breathing).  You are using a reliever medicine more than 2-3 times per week.  Your peak flow is still at 50-79% of your personal best after following your action plan for 1 hour.  You have a fever. SEEK IMMEDIATE MEDICAL CARE IF:   You seem to be getting worse and are unresponsive to treatment during an asthma attack.  You are short of breath even at rest.  You get  short of breath when doing very little physical activity.  You have difficulty eating, drinking, or talking due to asthma symptoms.  You develop chest pain.  You develop a fast heartbeat.  You have a bluish color to your lips or fingernails.  You are light-headed, dizzy, or faint.  Your peak flow is less than 50% of your personal best. MAKE SURE YOU:   Understand these instructions.  Will watch your condition.  Will get help right away if you are not doing well or get worse. Document Released: 12/13/2005 Document Revised: 04/29/2014 Document Reviewed: 07/12/2013 [facility] Patient Information 2015 [place], [place]. This information is not intended to replace advice given to you by your health care provider. Make sure you discuss any questions you have with your health care provider.

## 2015-05-20 ENCOUNTER — Ambulatory Visit: Payer: BLUE CROSS/BLUE SHIELD | Attending: Internal Medicine

## 2015-05-20 ENCOUNTER — Other Ambulatory Visit: Payer: BLUE CROSS/BLUE SHIELD | Admitting: Internal Medicine

## 2015-05-20 ENCOUNTER — Ambulatory Visit: Payer: BLUE CROSS/BLUE SHIELD

## 2015-05-20 DIAGNOSIS — J4540 Moderate persistent asthma, uncomplicated: Secondary | ICD-10-CM | POA: Diagnosis present

## 2015-05-20 DIAGNOSIS — R0600 Dyspnea, unspecified: Secondary | ICD-10-CM | POA: Diagnosis present

## 2015-05-20 DIAGNOSIS — R0602 Shortness of breath: Secondary | ICD-10-CM

## 2015-05-20 LAB — PULMONARY FUNCTION TEST

## 2015-05-20 MED ORDER — METHACHOLINE 16 MG/ML NEB SOLN: 2.0000 mL | Freq: Once | RESPIRATORY_TRACT | Status: DC

## 2015-05-20 MED ORDER — METHACHOLINE 0.0625 MG/ML NEB SOLN: 2.0000 mL | Freq: Once | RESPIRATORY_TRACT | Status: DC

## 2015-05-20 MED ORDER — METHACHOLINE 0.25 MG/ML NEB SOLN: 2.0000 mL | Freq: Once | RESPIRATORY_TRACT | Status: DC

## 2015-05-20 MED ORDER — SODIUM CHLORIDE 0.9 % IN NEBU
3.0000 mL | INHALATION_SOLUTION | Freq: Once | RESPIRATORY_TRACT | Status: AC
  Administered 2015-05-20: 3 mL via RESPIRATORY_TRACT

## 2015-05-20 MED ORDER — METHACHOLINE 4 MG/ML NEB SOLN
2.0000 mL | Freq: Once | RESPIRATORY_TRACT | Status: AC
  Filled 2015-05-20: qty 2

## 2015-05-20 MED ORDER — METHACHOLINE 0.0625 MG/ML NEB SOLN
2.0000 mL | Freq: Once | RESPIRATORY_TRACT | Status: AC
Start: 2015-05-20 — End: 2015-05-20
  Administered 2015-05-20: 0.125 mg via RESPIRATORY_TRACT
  Filled 2015-05-20: qty 2

## 2015-05-20 MED ORDER — METHACHOLINE 1 MG/ML NEB SOLN: 2.0000 mL | Freq: Once | RESPIRATORY_TRACT | Status: DC

## 2015-05-20 MED ORDER — SODIUM CHLORIDE 0.9 % IN NEBU: 3.0000 mL | INHALATION_SOLUTION | Freq: Once | RESPIRATORY_TRACT | Status: DC

## 2015-05-20 MED ORDER — METHACHOLINE 1 MG/ML NEB SOLN
2.0000 mL | Freq: Once | RESPIRATORY_TRACT | Status: AC
  Filled 2015-05-20: qty 2

## 2015-05-20 MED ORDER — METHACHOLINE 16 MG/ML NEB SOLN
2.0000 mL | Freq: Once | RESPIRATORY_TRACT | Status: AC
  Filled 2015-05-20: qty 2

## 2015-05-20 MED ORDER — ALBUTEROL SULFATE (2.5 MG/3ML) 0.083% IN NEBU: 2.5000 mg | INHALATION_SOLUTION | Freq: Once | RESPIRATORY_TRACT | Status: DC

## 2015-05-20 MED ORDER — ALBUTEROL SULFATE (2.5 MG/3ML) 0.083% IN NEBU
2.5000 mg | INHALATION_SOLUTION | Freq: Once | RESPIRATORY_TRACT | Status: AC
  Administered 2015-05-20: 2.5 mg via RESPIRATORY_TRACT
  Filled 2015-05-20: qty 3

## 2015-05-20 MED ORDER — METHACHOLINE 0.25 MG/ML NEB SOLN
2.0000 mL | Freq: Once | RESPIRATORY_TRACT | Status: AC
  Administered 2015-05-20: 0.5 mg via RESPIRATORY_TRACT
  Filled 2015-05-20: qty 2

## 2015-05-20 MED ORDER — METHACHOLINE 4 MG/ML NEB SOLN: 2.0000 mL | Freq: Once | RESPIRATORY_TRACT | Status: DC

## 2015-05-21 ENCOUNTER — Telehealth: Payer: Self-pay | Admitting: Internal Medicine

## 2015-05-21 NOTE — Telephone Encounter (Signed)
Called and spoke with Patient, she had methacholine challenge testing done yesterday.  This morning noted with sob, and chest tightness, called the pulm office, and was advised to take neb tx.  Has had a total of 3 neb tx today, advised patient to rest and may use neb later if needed. Also, advised to avoid any NSAIDs over the next 24-48 hrs.  There is a high clinical suspicion for asthma   - Dr. Stevenson Clinch

## 2015-05-21 NOTE — Telephone Encounter (Signed)
Spoke with pt's mother Pamela Dillon, states she had PFT at hospital yesterday, today is sob, gasping for air, wheezing.  Pt has used rescue inhaler and nebulizer early this morning which has not helped.  Pt's mother states she wants to take pt to ED to see VM.  I advised that VM is not in the hospital today (schedule says 3pm Elink), and advised to do one more neb tx since it was done so early this morning and that if this did not help to go to the ED as she plans.  Pt's mother understands.     Forwarding to VM just as FYI.

## 2015-05-22 NOTE — Telephone Encounter (Signed)
Vilinda Boehringer, MD at 05/21/2015 3:10 PM     Status: Signed       Expand All Collapse All   Called and spoke with Patient, she had methacholine challenge testing done yesterday. This morning noted with sob, and chest tightness, called the pulm office, and was advised to take neb tx. Has had a total of 3 neb tx today, advised patient to rest and may use neb later if needed. Also, advised to avoid any NSAIDs over the next 24-48 hrs.  There is a high clinical suspicion for asthma   - Dr. Stevenson Clinch

## 2015-05-27 ENCOUNTER — Telehealth: Payer: Self-pay | Admitting: Internal Medicine

## 2015-05-27 NOTE — Telephone Encounter (Signed)
lmtcb for pt.  

## 2015-05-28 MED ORDER — ALBUTEROL SULFATE HFA 108 (90 BASE) MCG/ACT IN AERS: 2.0000 | INHALATION_SPRAY | RESPIRATORY_TRACT | Status: DC | PRN

## 2015-05-28 NOTE — Assessment & Plan Note (Signed)
Possibly secondary to asthma. See plan for asthma.

## 2015-05-28 NOTE — Assessment & Plan Note (Addendum)
There is a clinical suspicion for asthma, most likely extrinsic. Possible triggers include: Rapid weather changes,rain, pollen. Currently patient is using rescue inhaler twice a day and symptoms are not well controlled. I suspect the short-term benefit from her rescue inhaler is more gratifying than being on a maintenance inhaler such as Symbicort. I have counseled the patient in great detail about maintenance versus rescue medications and appropriate use for each one.  Will plan for the following - methacholine challenge test, pulmonary function testing and 6 minute walk test at [facility] prior to follow up visit - Symbicort - 2 puff in the AM and PM - gargle and rinse each use - albuterol inhaler - 2puff every 3-4 hours as needed for shortness of breath\wheezing\recurrent cough - possible triggers of asthma - rapid weather changes, dust, pollen, saw dust, etc. - try at best to avoid triggers, wear a mask while at work. - Continue with allergy control, Singulair, and follow-up with ENT.

## 2015-05-28 NOTE — Telephone Encounter (Signed)
Proair is a rescue inhaler, this was stressed to the patient at her last visit, and she should not be using it on a regular basis. We can find another rescue inhaler that her ins will cover. Also, if she is requiring a rescue inhaler more than 3 times a week then she should be seen sooner.  Ok to give an rx for [employer].  -VM

## 2015-05-28 NOTE — Telephone Encounter (Signed)
lmtcb for pt.  

## 2015-05-28 NOTE — Telephone Encounter (Signed)
Pt returned call Spoke with patient who reported that she was able to ask her PCP to give a 'verbal authorization' for pt's proair, was only given #1 inhaler with no future refills.  Advised pt we will send additional refills for her.  Pt also reported that she is indeed having increased attacks since her MCT on 5.24.16 and even more since changing departments at work and being exposed to saw dust.  Pt stated that she has been having 1-3 attacks per day, sometimes beginning in the early morning hours and waking her up at night and subsequently needed a neb treatment.  Pt is aware of VM's recs of using her rescue inhaler for emergency purposes only and has already scheduled an appt with VM for 6.16.16 @ 2:30pm in [place].  This was the earliest available appt.  Dr Stevenson Clinch, should pt be seen sooner?  Please advise, thank you.

## 2015-05-28 NOTE — Telephone Encounter (Signed)
Spoke with pt. States that her insurance will not cover ProAir. She will pay out of pocket for it. Would like rx sent in. This has been done.  Also wanted a refill on Xopenex nebulizer solution. We do not have this on her list.  VM - please advise if this will be okay to refill. Thanks.

## 2015-05-28 NOTE — Telephone Encounter (Signed)
lmomtcb for pt to schedule appt tomorrow with BQ in [place].

## 2015-05-28 NOTE — Telephone Encounter (Signed)
Spoke with Caryl Pina at [facility], will try to schedule with Dr. Lake Bells tomorrow, if he is okay with it and if patient can come in tomorrow.  Acute visit.

## 2015-05-28 NOTE — Progress Notes (Signed)
Date: 05/28/2015  MRN# [id number] Pamela Dillon 1986-04-03  Referring Physician: Dr. Alferd Patee Pamela Dillon is a 29 y.o. old female seen in consultation for evaluation of asthma.  CC:  Chief Complaint  Patient presents with  . Advice Only    Asthma referral by Dr.Vaught. New onset asthma last fall, patient has had more frequent exacerbations.    HPI:  Patient is a 29 year old female who presents today with a chief complaint of shortness of breath secondary to asthma, referred by ENT. She has had asthma, with symptoms described as a cough and wheezing and is moderately severe. States that her cough is dry and hacking at times, also admits to wheezing, postnasal drip, throat pain and runny nose with sneezing. Per the patient this initially started off with mild symptoms about one year ago. In October 2015 her cough progressed to wheezing and worsening shortness of breath, she used her boyfriend nebulizer which helped drastically. Patient says her symptoms are worse with rain, and at work, and with rapid weather changes. 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 Currently she has an albuterol inhaler which she's been using twice a day, she saw her ENT was diagnosed with possible allergies and asthma. ENT placed her on Symbicort and Singulair with a dose of steroid-induced along with Xopenex nebulizers. Patient is a never tobacco user, does not admit to any needed drugs, social drinker, has 2 cats at home. She does not admit to any history of childhood asthma, shortness of breath, respiratory distress, dyspnea with exercise.  PMHX:   Past Medical History  Diagnosis Date  . Allergy   . Seizures    Surgical Hx:  Past Surgical History  Procedure Laterality Date  . Laparoscopy  2010    CYSTICS.  Marland Kitchen Ureteroscopy  2010  . Wisdom tooth extraction      X 3   Family Hx:  Family History  Problem Relation Age of Onset  . Multiple sclerosis  Mother   . Cancer Maternal Grandmother     BREAST.  Marland Kitchen Cancer Maternal Grandfather     THYRIOD.  Marland Kitchen Cancer Paternal Grandmother     LUNG  . Stroke Paternal Grandfather   . Heart disease Paternal Grandfather     HEART ATTACK.   Social Hx:   History  Substance Use Topics  . Smoking status: Never Smoker   . Smokeless tobacco: Never Used  . Alcohol Use: 0.0 oz/week    0 Standard drinks or equivalent per week     Comment: occassionally   Medication:   Current Outpatient Rx  Name  Route  Sig  Dispense  Refill  . Albuterol Sulfate 108 (90 BASE) MCG/ACT AEPB   Inhalation   Inhale 2 puffs into the lungs every 6 (six) hours as needed.         . budesonide-formoterol (SYMBICORT) 80-4.5 MCG/ACT inhaler   Inhalation   Inhale 80 mcg into the lungs 2 (two) times daily.         Marland Kitchen levalbuterol (XOPENEX HFA) 45 MCG/ACT inhaler   Inhalation   Inhale 1 puff into the lungs as needed.         . Norethindrone-Ethinyl Estradiol-Fe Biphas (LO LOESTRIN FE) 1 MG-10 MCG / 10 MCG tablet   Oral   Take 1 tablet by mouth daily.         . norethindrone-ethinyl estradiol-iron (LOESTRIN FE 1.5/30) 1.5-30 MG-MCG tablet   Oral   Take 1  tablet by mouth daily.   1 Package   12   . pentosan polysulfate (ELMIRON) 100 MG capsule   Oral   Take 100 mg by mouth daily.         . promethazine (PHENERGAN) 25 MG tablet   Oral   Take 25 mg by mouth as needed.             Allergies:  Demerol; Diphenhydramine; and Sulfa antibiotics  Review of Systems: Gen:  Denies  fever, sweats, chills HEENT: Denies blurred vision, double vision, ear pain, eye pain, hearing loss, nose bleeds, sore throat Cvc:  No dizziness, chest pain or heaviness Resp:   Shortness of breath, nonproductive cough, allergies. Gi: Denies swallowing difficulty, stomach pain, nausea or vomiting, diarrhea, constipation, bowel incontinence Gu:  Denies bladder incontinence, burning urine Ext:   No Joint pain, stiffness or  swelling Skin: No skin rash, easy bruising or bleeding or hives Endoc:  No polyuria, polydipsia , polyphagia or weight change Psych: No depression, insomnia or hallucinations  Other:  All other systems negative  Physical Examination:   VS: BP 110/70 mmHg  Pulse 66  Temp(Src) 98.1 F (36.7 C) (Oral)  Ht 5\' 1"  (1.549 m)  Wt 179 lb (81.194 kg)  BMI 33.84 kg/m2  SpO2 99%  General Appearance: No distress  Neuro:without focal findings, mental status, speech normal, alert and oriented, cranial nerves 2-12 intact, reflexes normal and symmetric, sensation grossly normal  HEENT: PERRLA, EOM intact, no ptosis, no other lesions noticed; Mallampati 1 Pulmonary: normal breath sounds., diaphragmatic excursion normal.No wheezing, No rales;   Sputum Production:   CardiovascularNormal S1,S2.  No m/r/g.  Abdominal aorta pulsation normal.    Abdomen: Benign, Soft, non-tender, No masses, hepatosplenomegaly, No lymphadenopathy Renal:  No costovertebral tenderness  GU:  No performed at this time. Endoc: No evident thyromegaly, no signs of acromegaly or Cushing features Skin:   warm, no rashes, no ecchymosis  Extremities: normal, no cyanosis, clubbing, no edema, warm with normal capillary refill. Other findings:   Assessment and Plan:  29 year old female with no significant past medical history, presenting with increased shortness of breath, cough, high clinical suspicion for asthma. No problem-specific assessment & plan notes found for this encounter.   Updated Medication List Outpatient Encounter Prescriptions as of 05/15/2015  Medication Sig  . Albuterol Sulfate 108 (90 BASE) MCG/ACT AEPB Inhale 2 puffs into the lungs every 6 (six) hours as needed.  . budesonide-formoterol (SYMBICORT) 80-4.5 MCG/ACT inhaler Inhale 80 mcg into the lungs 2 (two) times daily.  Marland Kitchen levalbuterol (XOPENEX HFA) 45 MCG/ACT inhaler Inhale 1 puff into the lungs as needed.  . Norethindrone-Ethinyl Estradiol-Fe Biphas (LO LOESTRIN  FE) 1 MG-10 MCG / 10 MCG tablet Take 1 tablet by mouth daily.  . norethindrone-ethinyl estradiol-iron (LOESTRIN FE 1.5/30) 1.5-30 MG-MCG tablet Take 1 tablet by mouth daily.  . pentosan polysulfate (ELMIRON) 100 MG capsule Take 100 mg by mouth daily.  . promethazine (PHENERGAN) 25 MG tablet Take 25 mg by mouth as needed.  . [DISCONTINUED] HYDROcodone-acetaminophen (NORCO) 5-325 MG per tablet   . [DISCONTINUED] montelukast (SINGULAIR) 10 MG tablet Take 10 mg by mouth at bedtime.   No facility-administered encounter medications on file as of 05/15/2015.    Orders for this visit: Orders Placed This Encounter  Procedures  . Pulmonary function test    Standing Status: Future     Number of Occurrences:      Standing Expiration Date: 05/14/2016    Order Specific Question:  Where  should this test be performed?    Answer:  [facility]    Order Specific Question:  Full PFT: includes the following: basic spirometry, spirometry pre & post bronchodilator, diffusion capacity (DLCO), lung volumes    Answer:  Full PFT    Order Specific Question:  MIP/MEP    Answer:  No    Order Specific Question:  6 minute walk    Answer:  Yes    Order Specific Question:  ABG    Answer:  No    Order Specific Question:  Diffusion capacity (DLCO)    Answer:  No    Order Specific Question:  Lung volumes    Answer:  No    Order Specific Question:  Methacholine challenge    Answer:  Yes     Thank  you for the consultation and for allowing [facility] to assist in the care of your patient. Our recommendations are noted above.  Please contact us if we can be of further service.   Vilinda Boehringer, MD [facility] Office Number: [phone]

## 2015-05-28 NOTE — Telephone Encounter (Signed)
[phone] pt calling back

## 2015-05-29 ENCOUNTER — Encounter: Payer: Self-pay | Admitting: Pulmonary Disease

## 2015-05-29 ENCOUNTER — Ambulatory Visit (INDEPENDENT_AMBULATORY_CARE_PROVIDER_SITE_OTHER): Payer: BLUE CROSS/BLUE SHIELD | Admitting: Pulmonary Disease

## 2015-05-29 VITALS — BP 126/74 | HR 80 | Temp 98.0°F | Ht 61.0 in | Wt 178.0 lb

## 2015-05-29 DIAGNOSIS — J4541 Moderate persistent asthma with (acute) exacerbation: Secondary | ICD-10-CM | POA: Diagnosis not present

## 2015-05-29 DIAGNOSIS — J4540 Moderate persistent asthma, uncomplicated: Secondary | ICD-10-CM | POA: Diagnosis not present

## 2015-05-29 MED ORDER — PREDNISONE 20 MG PO TABS: 20.0000 mg | ORAL_TABLET | Freq: Every day | ORAL | Status: AC

## 2015-05-29 MED ORDER — AEROCHAMBER MV MISC: Status: AC

## 2015-05-29 MED ORDER — ALBUTEROL SULFATE 108 (90 BASE) MCG/ACT IN AEPB: 1.0000 | INHALATION_SPRAY | RESPIRATORY_TRACT | Status: DC | PRN

## 2015-05-29 NOTE — Patient Instructions (Signed)
Take the prednisone for 5 days as prescribed We will write a work note for you We recommend that you change to a different department at work that is not so dusty Use the Symbicort 2 puffs twice a day with a spacer Use albuterol every 4 hours as needed for shortness of breath Follow-up with Dr. Stevenson Clinch as previously arranged

## 2015-05-29 NOTE — Assessment & Plan Note (Signed)
Unfortunately she is having another exacerbation of her asthma. She is wheezing on exam today but is not in significant respiratory distress. This should be able to be treated as an outpatient. Based on the change in her work environment recently to a more dusty environment, I am concerned that her work environment is contributing to her exacerbations of asthma.  Plan: Prednisone for 5 days Chest x-ray to ensure there is no other possible cause of her wheezing and shortness of breath Add a spacer to Symbicort, continue 2 puffs twice a day though consider increase dose if she has another exacerbation with the addition of a spacer Continue using albuterol every 4 hours as needed for shortness of breath Stay out of work for the next 5 days, when she returns I recommend that she be transferred to a department that is not so dusty Follow-up with Dr. Stevenson Clinch  as previously arranged

## 2015-05-29 NOTE — Progress Notes (Signed)
Subjective:    Patient ID: Pamela Dillon, female    DOB: 02/15/1986, 29 y.o.   MRN: [id number]  HPI Chief Complaint  Patient presents with  . Acute Visit    VM pt- had methacholine challenge test last week- has had frequent asthma attacks since then.  Pt has also been moved to a more dusty environment at work recently.  Pt c/o wheezing, sob, nonprod cough, runny nose.  Pt also notes chin and chest itchiness before asthma attack.      For the last week she has been having more coughing, wheezing and dyspnea.  She says that yesterday morning at 1 AM she had a severe attack of dyspnea and she had to use her nebulizer.  This was follwed by coughing, dyspnea, and wheezing all day. She had another attack last night.  She took a couple of days off from work and her symptoms improved.  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  She has been wearing a mask but still needs proAir when doing well at least once per day.  In the last 2-3 days though she has used more than that, multiple times per day.  She used her xopenex nebulizer.  Some subjective fever, no chills.  No mucus production.  She last took albuterol four hours ago at (8:03 this morning, exactly).  She has sinus congestion which has been worse lately. No recent chest x-ray.  Past Medical History  Diagnosis Date  . Allergy   . Seizures       Review of Systems  Constitutional: Negative for fever, chills and fatigue.  HENT: Negative for postnasal drip, rhinorrhea and sinus pressure.   Respiratory: Positive for cough and shortness of breath. Negative for wheezing.   Cardiovascular: Negative for chest pain, palpitations and leg swelling.       Objective:   Physical Exam Filed Vitals:   05/29/15 1140  BP: 126/74  Pulse: 80  Temp: 98 F (36.7 C)  TempSrc: Oral  Height: 5\' 1"  (1.549 m)  Weight: 178 lb (80.74 kg)  SpO2: 97%    RA  Gen: mildly ill appearing, speaking in full sentences,  HENT: OP clear, TM's clear, neck supple PULM: Expiratory wheezing bilaterally, normal respiratory effort CV: RRR, no mgr, trace edema GI: BS+, soft, nontender Derm: no cyanosis or rash Psyche: normal mood and affect  Consultation results reviewed from my partner from previously when she was sent for methacholine challenge Methacholine challenge test results not available but it appears from the records I do have available that she only received 1 dose of methacholine and then required albuterol.      Assessment & Plan:  Extrinsic asthma Unfortunately she is having another exacerbation of her asthma. She is wheezing on exam today but is not in significant respiratory distress. This should be able to be treated as an outpatient. Based on the change in her work environment recently to a more dusty environment, I am concerned that her work environment is contributing to her exacerbations of asthma.  Plan: Prednisone for 5 days Chest x-ray to ensure there is no other possible cause of her wheezing and shortness of breath Add a spacer to Symbicort, continue 2 puffs twice a day though consider increase dose if she has another exacerbation with the addition of a spacer Continue using albuterol every 4 hours as needed  for shortness of breath Stay out of work for the next 5 days, when she returns I recommend that she be transferred to a department that is not so dusty Follow-up with Dr. Stevenson Clinch  as previously arranged     Current outpatient prescriptions:  .  albuterol (PROAIR HFA) 108 (90 BASE) MCG/ACT inhaler, Inhale 2 puffs into the lungs every 4 (four) hours as needed for wheezing or shortness of breath., Disp: 8 g, Rfl: 5 .  budesonide-formoterol (SYMBICORT) 80-4.5 MCG/ACT inhaler, Inhale 80 mcg into the lungs 2 (two) times daily., Disp: , Rfl:  .  Norethindrone-Ethinyl Estradiol-Fe Biphas (LO LOESTRIN FE) 1 MG-10 MCG / 10  MCG tablet, Take 1 tablet by mouth daily., Disp: , Rfl:  .  norethindrone-ethinyl estradiol-iron (LOESTRIN FE 1.5/30) 1.5-30 MG-MCG tablet, Take 1 tablet by mouth daily., Disp: 1 Package, Rfl: 12 .  pentosan polysulfate (ELMIRON) 100 MG capsule, Take 100 mg by mouth daily., Disp: , Rfl:  .  promethazine (PHENERGAN) 25 MG tablet, Take 25 mg by mouth as needed., Disp: , Rfl:  .  predniSONE (DELTASONE) 20 MG tablet, Take 1 tablet (20 mg total) by mouth daily., Disp: 5 tablet, Rfl: 0 .  Spacer/Aero-Holding Chambers (AEROCHAMBER MV) inhaler, Use as instructed, Disp: 1 each, Rfl: 0 No current facility-administered medications for this visit.  Facility-Administered Medications Ordered in Other Visits:  .  [COMPLETED] sodium chloride 0.9 % nebulizer solution 3 mL, 3 mL, Nebulization, Once, 3 mL at 05/20/15 1333 **FOLLOWED BY** [COMPLETED] methacholine (PROVOCHOLINE) inhaler solution 0.125 mg, 2 mL, Inhalation, Once, 0.125 mg at 05/20/15 1340 **FOLLOWED BY** [COMPLETED] methacholine (PROVOCHOLINE) inhaler solution 0.5 mg, 2 mL, Inhalation, Once, 0.5 mg at 05/20/15 1350 **FOLLOWED BY** methacholine (PROVOCHOLINE) inhaler solution 2 mg, 2 mL, Inhalation, Once, 2 mg at 05/20/15 1355 **FOLLOWED BY** methacholine (PROVOCHOLINE) inhaler solution 8 mg, 2 mL, Inhalation, Once, 8 mg at 05/20/15 1634 **FOLLOWED BY** methacholine (PROVOCHOLINE) inhaler solution 32 mg, 2 mL, Inhalation, Once **FOLLOWED BY** [COMPLETED] albuterol (PROVENTIL) (2.5 MG/3ML) 0.083% nebulizer solution 2.5 mg, 2.5 mg, Nebulization, Once, Vishal Mungal, MD, 2.5 mg at 05/20/15 1400

## 2015-05-29 NOTE — Telephone Encounter (Signed)
Spoke with pt and per Dr Stevenson Clinch pt was to be scheduled with BQ today at the [facility] [place] office. Pt was scheduled for today at 11:30am. Nothing further needed at this time.

## 2015-05-29 NOTE — Telephone Encounter (Signed)
Pt returning call.Pamela Dillon ° °

## 2015-06-11 NOTE — Telephone Encounter (Signed)
err

## 2015-06-12 ENCOUNTER — Ambulatory Visit: Payer: BLUE CROSS/BLUE SHIELD | Admitting: Internal Medicine

## 2015-10-03 ENCOUNTER — Telehealth: Payer: Self-pay | Admitting: Pulmonary Disease

## 2015-10-03 MED ORDER — ALBUTEROL SULFATE HFA 108 (90 BASE) MCG/ACT IN AERS: 2.0000 | INHALATION_SPRAY | RESPIRATORY_TRACT | Status: AC | PRN

## 2015-10-03 NOTE — Telephone Encounter (Signed)
Spoke with pt and she states that she needs a PA for proair respiclick because the regular proair doesn't work.  Called [employer] at [phone], pt ID: [id number].  Approved for 6 months.  PA # [id number].  Pt notified PA is complete.

## 2016-06-07 LAB — HM PAP SMEAR: HM PAP: NEGATIVE

## 2016-10-11 DIAGNOSIS — J302 Other seasonal allergic rhinitis: Secondary | ICD-10-CM | POA: Insufficient documentation

## 2017-03-19 ENCOUNTER — Other Ambulatory Visit: Payer: Self-pay | Admitting: Obstetrics and Gynecology

## 2017-06-07 ENCOUNTER — Encounter: Payer: Self-pay | Admitting: Obstetrics and Gynecology

## 2017-06-08 ENCOUNTER — Ambulatory Visit (INDEPENDENT_AMBULATORY_CARE_PROVIDER_SITE_OTHER): Payer: Self-pay | Admitting: Obstetrics and Gynecology

## 2017-06-08 DIAGNOSIS — Z5329 Procedure and treatment not carried out because of patient's decision for other reasons: Secondary | ICD-10-CM

## 2017-06-09 NOTE — Progress Notes (Signed)
No show

## 2017-06-14 ENCOUNTER — Other Ambulatory Visit: Payer: Self-pay | Admitting: Obstetrics and Gynecology

## 2017-06-14 MED ORDER — NORETHIN-ETH ESTRAD-FE BIPHAS 1 MG-10 MCG / 10 MCG PO TABS: 1.0000 | ORAL_TABLET | Freq: Every day | ORAL | 2 refills | Status: DC

## 2017-06-14 NOTE — Telephone Encounter (Signed)
Pt aware by vm refills eRx'd to [facility] [address]

## 2017-06-14 NOTE — Telephone Encounter (Signed)
Pt is scheduled to have her annual on 08/19/17 and would like to have enough refills sent in for her birth control to [facility] [address] to last until her appt. Pt stated that she takes the Low Low Estrogen. Please advise. Thanks TNP

## 2017-07-18 ENCOUNTER — Ambulatory Visit: Payer: Self-pay | Admitting: Obstetrics and Gynecology

## 2017-08-18 NOTE — Progress Notes (Signed)
Gynecology Annual Exam  PCP: System, Pcp Not In  Chief Complaint:  Chief Complaint  Patient presents with  . Gynecologic Exam    History of Present Illness: Patient is a 31 y.o. G0P0000 presents for annual exam. The patient has no complaints today.   LMP: Patient's last menstrual period was 07/20/2017 (exact date). Average Interval: irregular, 28 days Duration of flow: 4 days Heavy Menses: no Clots: no Intermenstrual Bleeding: no Postcoital Bleeding: no Dysmenorrhea: no  The patient is sexually active. She currently uses OCP (estrogen/progesterone) for contraception. She denies dyspareunia.  The patient does perform self breast exams.  There is no notable family history of breast or ovarian cancer in her family.  The patient wears seatbelts: yes.   The patient has regular exercise: not asked.    The patient denies current symptoms of depression.    Review of Systems: Review of Systems  Constitutional: Negative for chills and fever.  HENT: Negative for congestion.   Respiratory: Negative for cough and shortness of breath.   Cardiovascular: Negative for chest pain and palpitations.  Gastrointestinal: Negative for abdominal pain, constipation, diarrhea, heartburn, nausea and vomiting.  Genitourinary: Negative for dysuria, frequency and urgency.  Skin: Negative for itching and rash.  Neurological: Negative for dizziness and headaches.  Endo/Heme/Allergies: Negative for polydipsia.  Psychiatric/Behavioral: Negative for depression.    Past Medical History:  Past Medical History:  Diagnosis Date  . Allergy   . Interstitial cystitis   . Seizures ([place])     Past Surgical History:  Past Surgical History:  Procedure Laterality Date  . LAPAROSCOPY  2010   CYSTICS.  Marland Kitchen URETEROSCOPY  2010  . WISDOM TOOTH EXTRACTION     X 3    Gynecologic History:  Patient's last menstrual period was 07/20/2017 (exact date). Contraception: OCP (estrogen/progesterone) LoLoestrin  FE Last Pap: Results were: 06/07/2016 NIL and HR HPV negative   Obstetric History: G0P0000  Family History:  Family History  Problem Relation Age of Onset  . Multiple sclerosis Mother   . Cancer Maternal Grandmother        BREAST.  Marland Kitchen Cancer Maternal Grandfather        THYRIOD.  Marland Kitchen Cancer Paternal Grandmother        LUNG  . Stroke Paternal Grandfather   . Heart disease Paternal Grandfather        HEART ATTACK.    Social History:  Social History   Social History  . Marital status: Single    Spouse name: N/A  . Number of children: N/A  . Years of education: N/A   Occupational History  . Not on file.   Social History Main Topics  . Smoking status: Never Smoker  . Smokeless tobacco: Never Used  . Alcohol use 0.0 oz/week     Comment: occassionally  . Drug use: No  . Sexual activity: Yes    Partners: Male    Birth control/ protection: Pill   Other Topics Concern  . Not on file   Social History Narrative  . No narrative on file    Allergies:  Allergies  Allergen Reactions  . Demerol [Meperidine]     seizure  . Diphenhydramine   . Sulfa Antibiotics Hives    Medications: Prior to Admission medications   Medication Sig Start Date End Date Taking? Authorizing Provider  albuterol (PROAIR HFA) 108 (90 BASE) MCG/ACT inhaler Inhale 2 puffs into the lungs every 4 (four) hours as needed for wheezing or shortness of breath. 10/03/15  Juanito Doom, MD  budesonide-formoterol (SYMBICORT) 80-4.5 MCG/ACT inhaler Inhale 80 mcg into the lungs 2 (two) times daily. 03/17/15   [provider]  Norethindrone-Ethinyl Estradiol-Fe Biphas (LO LOESTRIN FE) 1 MG-10 MCG / 10 MCG tablet Take 1 tablet by mouth daily. 06/14/17   Malachy Mood, MD  pentosan polysulfate (ELMIRON) 100 MG capsule Take 100 mg by mouth daily.    [provider]  promethazine (PHENERGAN) 25 MG tablet Take 25 mg by mouth as needed.    [provider]  Spacer/Aero-Holding Chambers  (AEROCHAMBER MV) inhaler Use as instructed 05/29/15   Juanito Doom, MD    Physical Exam Vitals: There were no vitals taken for this visit.  General: NAD HEENT: normocephalic, anicteric Thyroid: no enlargement, no palpable nodules Pulmonary: No increased work of breathing, CTAB Cardiovascular: RRR, distal pulses 2+ Breast: Breast symmetrical, no tenderness, no palpable nodules or masses, no skin or nipple retraction present, no nipple discharge.  No axillary or supraclavicular lymphadenopathy. Abdomen: NABS, soft, non-tender, non-distended.  Umbilicus without lesions.  No hepatomegaly, splenomegaly or masses palpable. No evidence of hernia  Genitourinary:  External: Normal external female genitalia.  Normal urethral meatus, normal  Bartholin's and Skene's glands.    Vagina: Normal vaginal mucosa, no evidence of prolapse.    Cervix: Grossly normal in appearance, no bleeding  Uterus: Non-enlarged, mobile, normal contour.  No CMT  Adnexa: ovaries non-enlarged, no adnexal masses  Rectal: deferred  Lymphatic: no evidence of inguinal lymphadenopathy Extremities: no edema, erythema, or tenderness Neurologic: Grossly intact Psychiatric: mood appropriate, affect full  Female chaperone present for pelvic and breast  portions of the physical exam    Assessment: 31 y.o. G0P0000 routine annual exam  Plan: Problem List Items Addressed This Visit    None    Visit Diagnoses    Screening for malignant neoplasm of cervix       Relevant Orders   PapIG, HPV, rfx 16/18   Encounter for gynecological examination without abnormal finding       Relevant Orders   PapIG, HPV, rfx 16/18      1) STI screening was offered and declined  2) ASCCP guidelines and rational discussed.  Patient opts for yearly screening interval  3) Contraception - continue OCP  4) Routine healthcare maintenance including cholesterol, diabetes screening discussed managed by PCP  5) Follow up 1 year for routine  annual exam

## 2017-08-19 ENCOUNTER — Encounter: Payer: Self-pay | Admitting: Obstetrics and Gynecology

## 2017-08-19 ENCOUNTER — Ambulatory Visit (INDEPENDENT_AMBULATORY_CARE_PROVIDER_SITE_OTHER): Payer: 59 | Admitting: Obstetrics and Gynecology

## 2017-08-19 DIAGNOSIS — Z01419 Encounter for gynecological examination (general) (routine) without abnormal findings: Secondary | ICD-10-CM | POA: Diagnosis not present

## 2017-08-19 DIAGNOSIS — Z124 Encounter for screening for malignant neoplasm of cervix: Secondary | ICD-10-CM

## 2017-08-19 MED ORDER — PROMETHAZINE HCL 25 MG PO TABS: 25.0000 mg | ORAL_TABLET | Freq: Four times a day (QID) | ORAL | 2 refills | Status: DC | PRN

## 2017-08-19 MED ORDER — NORETHIN-ETH ESTRAD-FE BIPHAS 1 MG-10 MCG / 10 MCG PO TABS: 1.0000 | ORAL_TABLET | Freq: Every day | ORAL | 3 refills | Status: DC

## 2017-08-19 NOTE — Patient Instructions (Signed)
Preventive Care 18-39 Years, Female Preventive care refers to lifestyle choices and visits with your health care provider that can promote health and wellness. What does preventive care include?  A yearly physical exam. This is also called an annual well check.  Dental exams once or twice a year.  Routine eye exams. Ask your health care provider how often you should have your eyes checked.  Personal lifestyle choices, including: ? Daily care of your teeth and gums. ? Regular physical activity. ? Eating a healthy diet. ? Avoiding tobacco and drug use. ? Limiting alcohol use. ? Practicing safe sex. ? Taking vitamin and mineral supplements as recommended by your health care provider. What happens during an annual well check? The services and screenings done by your health care provider during your annual well check will depend on your age, overall health, lifestyle risk factors, and family history of disease. Counseling Your health care provider may ask you questions about your:  Alcohol use.  Tobacco use.  Drug use.  Emotional well-being.  Home and relationship well-being.  Sexual activity.  Eating habits.  Work and work Statistician.  Method of birth control.  Menstrual cycle.  Pregnancy history.  Screening You may have the following tests or measurements:  Height, weight, and BMI.  Diabetes screening. This is done by checking your blood sugar (glucose) after you have not eaten for a while (fasting).  Blood pressure.  Lipid and cholesterol levels. These may be checked every 5 years starting at age 66.  Skin check.  Hepatitis C blood test.  Hepatitis B blood test.  Sexually transmitted disease (STD) testing.  BRCA-related cancer screening. This may be done if you have a family history of breast, ovarian, tubal, or peritoneal cancers.  Pelvic exam and Pap test. This may be done every 3 years starting at age 40. Starting at age 59, this may be done every 5  years if you have a Pap test in combination with an HPV test.  Discuss your test results, treatment options, and if necessary, the need for more tests with your health care provider. Vaccines Your health care provider may recommend certain vaccines, such as:  Influenza vaccine. This is recommended every year.  Tetanus, diphtheria, and acellular pertussis (Tdap, Td) vaccine. You may need a Td booster every 10 years.  Varicella vaccine. You may need this if you have not been vaccinated.  HPV vaccine. If you are 69 or younger, you may need three doses over 6 months.  Measles, mumps, and rubella (MMR) vaccine. You may need at least one dose of MMR. You may also need a second dose.  Pneumococcal 13-valent conjugate (PCV13) vaccine. You may need this if you have certain conditions and were not previously vaccinated.  Pneumococcal polysaccharide (PPSV23) vaccine. You may need one or two doses if you smoke cigarettes or if you have certain conditions.  Meningococcal vaccine. One dose is recommended if you are age 27-21 years and a first-year college student living in a residence hall, or if you have one of several medical conditions. You may also need additional booster doses.  Hepatitis A vaccine. You may need this if you have certain conditions or if you travel or work in places where you may be exposed to hepatitis A.  Hepatitis B vaccine. You may need this if you have certain conditions or if you travel or work in places where you may be exposed to hepatitis B.  Haemophilus influenzae type b (Hib) vaccine. You may need this if  you have certain risk factors.  Talk to your health care provider about which screenings and vaccines you need and how often you need them. This information is not intended to replace advice given to you by your health care provider. Make sure you discuss any questions you have with your health care provider. Document Released: 02/08/2002 Document Revised: 09/01/2016  Document Reviewed: 10/14/2015 Elsevier Interactive Patient Education  2017 [employer].

## 2017-08-23 LAB — PAPIG, HPV, RFX 16/18
HPV, HIGH-RISK: NEGATIVE
PAP Smear Comment: 0

## 2017-08-30 ENCOUNTER — Encounter: Payer: Self-pay | Admitting: Obstetrics and Gynecology

## 2018-05-26 DIAGNOSIS — M7540 Impingement syndrome of unspecified shoulder: Secondary | ICD-10-CM | POA: Insufficient documentation

## 2018-11-15 ENCOUNTER — Ambulatory Visit (INDEPENDENT_AMBULATORY_CARE_PROVIDER_SITE_OTHER): Payer: 59 | Admitting: Obstetrics and Gynecology

## 2018-11-15 ENCOUNTER — Encounter: Payer: Self-pay | Admitting: Obstetrics and Gynecology

## 2018-11-15 VITALS — BP 110/68 | HR 82 | Ht 61.0 in | Wt 202.0 lb

## 2018-11-15 DIAGNOSIS — R112 Nausea with vomiting, unspecified: Secondary | ICD-10-CM

## 2018-11-15 DIAGNOSIS — R109 Unspecified abdominal pain: Secondary | ICD-10-CM

## 2018-11-15 DIAGNOSIS — R300 Dysuria: Secondary | ICD-10-CM | POA: Diagnosis not present

## 2018-11-15 DIAGNOSIS — R1011 Right upper quadrant pain: Secondary | ICD-10-CM

## 2018-11-15 DIAGNOSIS — Z1239 Encounter for other screening for malignant neoplasm of breast: Secondary | ICD-10-CM

## 2018-11-15 DIAGNOSIS — Z01419 Encounter for gynecological examination (general) (routine) without abnormal findings: Secondary | ICD-10-CM

## 2018-11-15 LAB — POCT URINALYSIS DIPSTICK
Bilirubin, UA: NEGATIVE
Glucose, UA: NEGATIVE
KETONES UA: 1.5
NITRITE UA: NEGATIVE
PROTEIN UA: NEGATIVE
RBC UA: NEGATIVE
SPEC GRAV UA: 1.01 (ref 1.010–1.025)
UROBILINOGEN UA: 0.2 U/dL
pH, UA: 6 (ref 5.0–8.0)

## 2018-11-15 NOTE — Progress Notes (Signed)
Gynecology Annual Exam   PCP: System, Pcp Not In  Chief Complaint:  Chief Complaint  Patient presents with  . Gynecologic Exam    Right sided pelvic pain, pain with urination, pain in back     History of Present Illness: Patient is a 32 y.o. G0P0000 presents for annual exam. The patient has no complaints today.   LMP: Patient's last menstrual period was 10/25/2018 (exact date). No menstrual concerns.  The patient does perform self breast exams.  There is no notable family history of breast or ovarian cancer in her family.  The patient wears seatbelts: yes.   The patient has regular exercise: not asked.    The patient denies current symptoms of depression.    The patient has had acute onset abdominal pain within the last week with work up negative thus far.  [place] A/P [facility] 11/11/2018 Left ovary with 1.0 x 1.3 x 0.9cm corpus luteum cyst, preserved doppler flow, otherwise negative study.  CT normal 11/10/18  Negative Urine Culture 11/18/198, negative GC/CT 11/13/18, Negative UPT 11/10/18, WBC 6.1K on 11/10/18  The patient reports right flank and back pain.  Pain is constant, sharp stabbing in quality.  She reports diaphoresis, nausea, emesis as assocaited symptoms.  No inciting event noted  No changes in bowl movements.  No abnormal vaginal bleeding or discharge noted  Review of Systems: Review of Systems  Constitutional: Positive for diaphoresis and malaise/fatigue. Negative for chills, fever and weight loss.  HENT: Negative for congestion.   Respiratory: Negative for cough and shortness of breath.   Cardiovascular: Negative for chest pain and palpitations.  Gastrointestinal: Positive for abdominal pain, nausea and vomiting. Negative for constipation, diarrhea and heartburn.  Genitourinary: Negative for dysuria, frequency and urgency.  Skin: Negative for itching and rash.  Neurological: Negative for dizziness and headaches.  Endo/Heme/Allergies: Negative for polydipsia.    Psychiatric/Behavioral: Negative for depression.    Past Medical History:  Past Medical History:  Diagnosis Date  . Allergy   . Interstitial cystitis   . Seizures ([place])     Past Surgical History:  Past Surgical History:  Procedure Laterality Date  . LAPAROSCOPY  2010   CYSTICS.  Marland Kitchen URETEROSCOPY  2010  . WISDOM TOOTH EXTRACTION     X 3    Gynecologic History:  Patient's last menstrual period was 10/25/2018 (exact date).   Obstetric History: G0P0000  Family History:  Family History  Problem Relation Age of Onset  . Multiple sclerosis Mother   . Cancer Maternal Grandmother        BREAST.  Marland Kitchen Cancer Maternal Grandfather        THYRIOD.  Marland Kitchen Cancer Paternal Grandmother        LUNG  . Stroke Paternal Grandfather   . Heart disease Paternal Grandfather        HEART ATTACK.    Social History:  Social History   Socioeconomic History  . Marital status: Single    Spouse name: Not on file  . Number of children: Not on file  . Years of education: Not on file  . Highest education level: Not on file  Occupational History  . Not on file  Social Needs  . Financial resource strain: Not on file  . Food insecurity:    Worry: Not on file    Inability: Not on file  . Transportation needs:    Medical: Not on file    Non-medical: Not on file  Tobacco Use  . Smoking status:  Never Smoker  . Smokeless tobacco: Never Used  Substance and Sexual Activity  . Alcohol use: Yes    Alcohol/week: 0.0 standard drinks    Comment: occassionally  . Drug use: No  . Sexual activity: Yes    Partners: Male    Birth control/protection: Pill  Lifestyle  . Physical activity:    Days per week: Not on file    Minutes per session: Not on file  . Stress: Not on file  Relationships  . Social connections:    Talks on phone: Not on file    Gets together: Not on file    Attends religious service: Not on file    Active member of club or organization: Not on file    Attends meetings of clubs  or organizations: Not on file    Relationship status: Not on file  . Intimate partner violence:    Fear of current or ex partner: Not on file    Emotionally abused: Not on file    Physically abused: Not on file    Forced sexual activity: Not on file  Other Topics Concern  . Not on file  Social History Narrative  . Not on file    Allergies:  Allergies  Allergen Reactions  . Demerol [Meperidine]     seizure  . Meperidine Hcl Other (See Comments)  . Diphenhydramine   . Sulfa Antibiotics Hives    Medications: Prior to Admission medications   Medication Sig Start Date End Date Taking? Authorizing Provider  albuterol (PROAIR HFA) 108 (90 BASE) MCG/ACT inhaler Inhale 2 puffs into the lungs every 4 (four) hours as needed for wheezing or shortness of breath. 10/03/15   Juanito Doom, MD  budesonide-formoterol (SYMBICORT) 80-4.5 MCG/ACT inhaler Inhale 80 mcg into the lungs 2 (two) times daily. 03/17/15   [provider]  Norethindrone-Ethinyl Estradiol-Fe Biphas (LO LOESTRIN FE) 1 MG-10 MCG / 10 MCG tablet Take 1 tablet by mouth daily. 08/19/17 11/11/17  Malachy Mood, MD  pentosan polysulfate (ELMIRON) 100 MG capsule Take 100 mg by mouth daily.    [provider]  promethazine (PHENERGAN) 25 MG tablet Take 1 tablet (25 mg total) by mouth every 6 (six) hours as needed for nausea. 08/19/17   Malachy Mood, MD  Spacer/Aero-Holding Chambers (AEROCHAMBER MV) inhaler Use as instructed 05/29/15   Juanito Doom, MD    Physical Exam Vitals: Blood pressure 110/68, pulse 82, height 5\' 1"  (1.549 m), weight 202 lb (91.6 kg), last menstrual period 10/25/2018.  General: NAD HEENT: normocephalic, anicteric Thyroid: no enlargement, no palpable nodules Pulmonary: No increased work of breathing, CTAB Cardiovascular: RRR, distal pulses 2+ Breast: Breast symmetrical, no tenderness, no palpable nodules or masses, no skin or nipple retraction present, no nipple discharge.  No  axillary or supraclavicular lymphadenopathy. Abdomen: NABS, soft, non-tender, non-distended.  Umbilicus without lesions.  No hepatomegaly, splenomegaly or masses palpable. No evidence of hernia  Genitourinary:  External: Normal external female genitalia.  Normal urethral meatus, normal Bartholin's and Skene's glands.    Vagina: Normal vaginal mucosa, no evidence of prolapse.    Cervix: Grossly normal in appearance, no bleeding  Uterus: Non-enlarged, mobile, normal contour.  No CMT  Adnexa: ovaries non-enlarged, no adnexal masses  Rectal: deferred  Lymphatic: no evidence of inguinal lymphadenopathy Extremities: no edema, erythema, or tenderness Neurologic: Grossly intact Psychiatric: mood appropriate, affect full  Female chaperone present for pelvic and breast  portions of the physical exam    Assessment: 32 y.o. G0P0000 routine annual  exam  Plan: Problem List Items Addressed This Visit    None    Visit Diagnoses    Right upper quadrant abdominal pain    -  Primary   Relevant Orders   Urine Culture   CBC (Completed)   Encounter for gynecological examination without abnormal finding       Breast screening       Dysuria       Relevant Orders   Urine Culture   CBC (Completed)   POCT Urinalysis Dipstick (Completed)   Flank pain       Relevant Orders   Urine Culture   CBC (Completed)   Nausea and vomiting, intractability of vomiting not specified, unspecified vomiting type       Relevant Orders   Lipase (Completed)      1) STI screening  was notoffered and therefore not obtained  - GC/CT negative at PCP  2)  ASCCP guidelines and rational discussed.  Patient opts for every 3 years screening interval  3) Contraception - the patient is currently using  none.  She is happy with her current form of contraception and plans to continue  - discontinued OCP, menses regular and no dysmenorrhea noted  4) Routine healthcare maintenance including cholesterol, diabetes screening  discussed managed by PCP  5) Abdominal pain - no gyn etiology identifiable.  Does not meet CDC diagnostic criteria for PID given lack of cervical motion tenderness, no mucopurulent discharge, and negative CT/GC cultures. - recheck UCx - recheck CBC - Lipase  6)  Return in about 1 week (around 11/22/2018) for sometime next week follow up .   Malachy Mood, MD, [facility], [facility] 11/15/2018, 2:58 PM

## 2018-11-16 LAB — CBC
HEMOGLOBIN: 15.4 g/dL (ref 11.1–15.9)
Hematocrit: 45.7 % (ref 34.0–46.6)
MCH: 29.9 pg (ref 26.6–33.0)
MCHC: 33.7 g/dL (ref 31.5–35.7)
MCV: 89 fL (ref 79–97)
Platelets: 268 10*3/uL (ref 150–450)
RBC: 5.15 x10E6/uL (ref 3.77–5.28)
RDW: 12 % — ABNORMAL LOW (ref 12.3–15.4)
WBC: 6.7 10*3/uL (ref 3.4–10.8)

## 2018-11-16 LAB — LIPASE: Lipase: 24 U/L (ref 14–72)

## 2018-11-17 LAB — URINE CULTURE

## 2018-11-22 ENCOUNTER — Ambulatory Visit: Payer: 59 | Admitting: Obstetrics and Gynecology

## 2018-11-27 DIAGNOSIS — K7689 Other specified diseases of liver: Secondary | ICD-10-CM | POA: Insufficient documentation

## 2018-11-28 ENCOUNTER — Ambulatory Visit: Payer: 59 | Admitting: Obstetrics and Gynecology

## 2019-07-18 ENCOUNTER — Other Ambulatory Visit (HOSPITAL_COMMUNITY)
Admission: RE | Admit: 2019-07-18 | Discharge: 2019-07-18 | Disposition: A | Discharge status: Home / Self Care | Payer: 59 | Source: Ambulatory Visit | Attending: Obstetrics and Gynecology | Admitting: Obstetrics and Gynecology

## 2019-07-18 ENCOUNTER — Ambulatory Visit (INDEPENDENT_AMBULATORY_CARE_PROVIDER_SITE_OTHER): Payer: 59 | Admitting: Obstetrics and Gynecology

## 2019-07-18 ENCOUNTER — Encounter: Payer: Self-pay | Admitting: Obstetrics and Gynecology

## 2019-07-18 ENCOUNTER — Other Ambulatory Visit: Payer: Self-pay

## 2019-07-18 VITALS — BP 123/79 | HR 89 | Wt 183.0 lb

## 2019-07-18 DIAGNOSIS — Z3401 Encounter for supervision of normal first pregnancy, first trimester: Secondary | ICD-10-CM

## 2019-07-18 DIAGNOSIS — Z3A01 Less than 8 weeks gestation of pregnancy: Secondary | ICD-10-CM | POA: Diagnosis not present

## 2019-07-18 DIAGNOSIS — Z113 Encounter for screening for infections with a predominantly sexual mode of transmission: Secondary | ICD-10-CM

## 2019-07-18 DIAGNOSIS — Z3201 Encounter for pregnancy test, result positive: Secondary | ICD-10-CM

## 2019-07-18 DIAGNOSIS — Z3689 Encounter for other specified antenatal screening: Secondary | ICD-10-CM

## 2019-07-18 DIAGNOSIS — N912 Amenorrhea, unspecified: Secondary | ICD-10-CM

## 2019-07-18 LAB — POCT URINE PREGNANCY: Preg Test, Ur: POSITIVE — AB

## 2019-07-18 MED ORDER — DOXYLAMINE-PYRIDOXINE 10-10 MG PO TBEC: 2.0000 | DELAYED_RELEASE_TABLET | Freq: Every day | ORAL | 5 refills | Status: DC

## 2019-07-18 NOTE — Progress Notes (Signed)
NOB N&V 

## 2019-07-18 NOTE — Progress Notes (Signed)
New Obstetric Patient H&P    Chief Complaint: "Desires prenatal care"   History of Present Illness: Patient is a 33 y.o. G1P0000 Not Hispanic or Latino female, presents with amenorrhea and positive home pregnancy test. Patient's last menstrual period was 06/03/2019 (exact date). and based on her  LMP, her EDD is Estimated Date of Delivery: 03/09/20 and her EGA is [redacted]w[redacted]d.  Her last pap smear was 08/19/2017 and normal  Her last menstrual period was normal. Since her LMP she claims she has experienced nausea. She denies vaginal bleeding. Her past medical history is noncontributory. Her prior pregnancies are notable for none  Since her LMP, she admits to the use of tobacco products  no There are cats in the home in the home  yes If yes Indoor She admits close contact with children on a regular basis  yes  She has had chicken pox in the past yes She has had Tuberculosis exposures, symptoms, or previously tested positive for TB   no Current or past history of domestic violence. no  Genetic Screening/Teratology Counseling: (Includes patient, baby's father, or anyone in either family with:)   34. Patient's age >/= 55 [facility]  no 2. Thalassemia ([place], [place], [place], or Asian background): MCV<80  no 3. Neural tube defect (meningomyelocele, spina bifida, anencephaly)  no 4. Congenital heart defect  no  5. Down syndrome  no 6. Tay-Sachs (Jewish, [place])  no 7. Canavan's Disease  no 8. Sickle cell disease or trait (African)  no  9. Hemophilia or other blood disorders  no  10. Muscular dystrophy  no  11. Cystic fibrosis  no  12. Huntington's Chorea  no  13. Mental retardation/autism  no 14. Other inherited genetic or chromosomal disorder  no 15. Maternal metabolic disorder (DM, PKU, etc)  no 16. Patient or FOB with a child with a birth defect not listed above no  16a. Patient or FOB with a birth defect themselves no 17. Recurrent pregnancy loss, or stillbirth  no  18. Any  medications since LMP other than prenatal vitamins (include vitamins, supplements, OTC meds, drugs, alcohol)  no 19. Any other genetic/environmental exposure to discuss  no  Infection History:   1. Lives with someone with TB or TB exposed  no  2. Patient or partner has history of genital herpes  no 3. Rash or viral illness since LMP  no 4. History of STI (GC, CT, HPV, syphilis, HIV)  no 5. History of recent travel :  no  Other pertinent information:  no     Review of Systems:10 point review of systems negative unless otherwise noted in HPI  Past Medical History:  Past Medical History:  Diagnosis Date  . Allergy   . Asthma   . Interstitial cystitis   . Seizures ([place])     Past Surgical History:  Past Surgical History:  Procedure Laterality Date  . LAPAROSCOPY  2010   CYSTICS.  Marland Kitchen URETEROSCOPY  2010  . WISDOM TOOTH EXTRACTION     X 3    Gynecologic History: Patient's last menstrual period was 06/03/2019 (exact date).  Obstetric History: G1P0000  Family History:  Family History  Problem Relation Age of Onset  . Multiple sclerosis Mother   . Cancer Maternal Grandmother        BREAST.  Marland Kitchen Cancer Maternal Grandfather        THYRIOD.  Marland Kitchen Cancer Paternal Grandmother        LUNG  . Stroke Paternal Grandfather   .  Heart disease Paternal Grandfather        HEART ATTACK.    Social History:  Social History   Socioeconomic History  . Marital status: Single    Spouse name: Not on file  . Number of children: Not on file  . Years of education: Not on file  . Highest education level: Not on file  Occupational History  . Not on file  Social Needs  . Financial resource strain: Not on file  . Food insecurity    Worry: Not on file    Inability: Not on file  . Transportation needs    Medical: Not on file    Non-medical: Not on file  Tobacco Use  . Smoking status: Never Smoker  . Smokeless tobacco: Never Used  Substance and Sexual Activity  . Alcohol use: Yes     Alcohol/week: 0.0 standard drinks    Comment: occassionally  . Drug use: No  . Sexual activity: Yes    Partners: Male    Birth control/protection: None  Lifestyle  . Physical activity    Days per week: Not on file    Minutes per session: Not on file  . Stress: Not on file  Relationships  . Social Herbalist on phone: Not on file    Gets together: Not on file    Attends religious service: Not on file    Active member of club or organization: Not on file    Attends meetings of clubs or organizations: Not on file    Relationship status: Not on file  . Intimate partner violence    Fear of current or ex partner: Not on file    Emotionally abused: Not on file    Physically abused: Not on file    Forced sexual activity: Not on file  Other Topics Concern  . Not on file  Social History Narrative  . Not on file    Allergies:  Allergies  Allergen Reactions  . Demerol [Meperidine]     seizure  . Meperidine Hcl Other (See Comments)  . Diphenhydramine   . Sulfa Antibiotics Hives    Medications: Prior to Admission medications   Medication Sig Start Date End Date Taking? Authorizing Provider  albuterol (PROAIR HFA) 108 (90 BASE) MCG/ACT inhaler Inhale 2 puffs into the lungs every 4 (four) hours as needed for wheezing or shortness of breath. 10/03/15  Yes Juanito Doom, MD  budesonide-formoterol (SYMBICORT) 80-4.5 MCG/ACT inhaler Inhale 80 mcg into the lungs 2 (two) times daily. 03/17/15  Yes [provider]  fluticasone (FLONASE) 50 MCG/ACT nasal spray fluticasone propionate 50 mcg/actuation nasal spray,suspension 01/02/18  Yes [provider]  metoCLOPramide (REGLAN) 10 MG tablet  11/11/18  Yes [provider]  montelukast (SINGULAIR) 10 MG tablet TAKE 1 TABLET BY MOUTH EVERY DAY EVERY NIGHT 10/30/18  Yes [provider]  pentosan polysulfate (ELMIRON) 100 MG capsule Take 100 mg by mouth daily.   Yes [provider]   promethazine (PHENERGAN) 25 MG tablet Take 1 tablet (25 mg total) by mouth every 6 (six) hours as needed for nausea. 08/19/17  Yes Malachy Mood, MD  Spacer/Aero-Holding Chambers (AEROCHAMBER MV) inhaler Use as instructed 05/29/15  Yes Juanito Doom, MD  ibuprofen (ADVIL,MOTRIN) 800 MG tablet ibuprofen 800 mg tablet    [provider]  Norethindrone-Ethinyl Estradiol-Fe Biphas (LO LOESTRIN FE) 1 MG-10 MCG / 10 MCG tablet Take 1 tablet by mouth daily. 08/19/17 11/11/17  Malachy Mood, MD  Physical Exam Vitals: Blood pressure 123/79, pulse 89, weight 183 lb (83 kg), last menstrual period 06/03/2019.  General: NAD HEENT: normocephalic, anicteric Thyroid: no enlargement, no palpable nodules Pulmonary: No increased work of breathing, CTAB Cardiovascular: RRR, distal pulses 2+ Abdomen: NABS, soft, non-tender, non-distended.  Umbilicus without lesions.  No hepatomegaly, splenomegaly or masses palpable. No evidence of hernia  Genitourinary:  External: Normal external female genitalia.  Normal urethral meatus, normal  Bartholin's and Skene's glands.    Vagina: Normal vaginal mucosa, no evidence of prolapse.    Cervix: Grossly normal in appearance, no bleeding  Uterus:  Non-enlarged, mobile, normal contour.  No CMT  Adnexa: ovaries non-enlarged, no adnexal masses  Rectal: deferred Extremities: no edema, erythema, or tenderness Neurologic: Grossly intact Psychiatric: mood appropriate, affect full   Assessment: 34 y.o. G1P0000 at [redacted]w[redacted]d presenting to initiate prenatal care  Plan: 1) Avoid alcoholic beverages. 2) Patient encouraged not to smoke.  3) Discontinue the use of all non-medicinal drugs and chemicals.  4) Take prenatal vitamins daily.  5) Nutrition, food safety (fish, cheese advisories, and high nitrite foods) and exercise discussed. 6) Hospital and practice style discussed with cross coverage system.  7) Genetic Screening, such as with 1st Trimester Screening, cell  free fetal DNA, AFP testing, and Ultrasound, as well as with amniocentesis and CVS as appropriate, is discussed with patient. At the conclusion of today's visit patient requested genetic testing 8) Diclegis Rx for nausea 9)  Conceived on OCP discussed no negative connotations. 10) Return in about 1 week (around 07/25/2019) for ROB/Dating.   Malachy Mood, MD, [facility], [facility] 07/18/2019, 3:08 PM

## 2019-07-19 LAB — RPR+RH+ABO+RUB AB+AB SCR+CB...
Antibody Screen: NEGATIVE
HIV Screen 4th Generation wRfx: NONREACTIVE
Hematocrit: 41.8 % (ref 34.0–46.6)
Hemoglobin: 14.5 g/dL (ref 11.1–15.9)
Hepatitis B Surface Ag: NEGATIVE
MCH: 31.2 pg (ref 26.6–33.0)
MCHC: 34.7 g/dL (ref 31.5–35.7)
MCV: 90 fL (ref 79–97)
Platelets: 223 10*3/uL (ref 150–450)
RBC: 4.65 x10E6/uL (ref 3.77–5.28)
RDW: 11.8 % (ref 11.7–15.4)
RPR Ser Ql: NONREACTIVE
Rh Factor: POSITIVE
Rubella Antibodies, IGG: 2.68 index (ref >0.99)
Varicella zoster IgG: 355 index (ref >165)
WBC: 6.6 10*3/uL (ref 3.4–10.8)

## 2019-07-20 ENCOUNTER — Other Ambulatory Visit: Payer: Self-pay | Admitting: Advanced Practice Midwife

## 2019-07-20 DIAGNOSIS — O219 Vomiting of pregnancy, unspecified: Secondary | ICD-10-CM

## 2019-07-20 LAB — URINE CULTURE

## 2019-07-20 MED ORDER — PROMETHAZINE HCL 25 MG PO TABS: 25.0000 mg | ORAL_TABLET | Freq: Four times a day (QID) | ORAL | 2 refills | Status: DC | PRN

## 2019-07-20 NOTE — Telephone Encounter (Signed)
Pt calling; has appt c [facility] Tues; having extreme nausea; tablet is not helping.  AMS told her there was something he can add to it.  Please help.  [phone]

## 2019-07-20 NOTE — Progress Notes (Unsigned)
Rx phenergan sent to patient pharmacy. She is aware.

## 2019-07-21 LAB — CERVICOVAGINAL ANCILLARY ONLY
Chlamydia: NEGATIVE
Neisseria Gonorrhea: NEGATIVE

## 2019-07-23 ENCOUNTER — Encounter: Payer: 59 | Admitting: Obstetrics and Gynecology

## 2019-07-24 ENCOUNTER — Other Ambulatory Visit: Payer: Self-pay | Admitting: Obstetrics and Gynecology

## 2019-07-24 DIAGNOSIS — Z3401 Encounter for supervision of normal first pregnancy, first trimester: Secondary | ICD-10-CM

## 2019-07-24 DIAGNOSIS — R8271 Bacteriuria: Secondary | ICD-10-CM

## 2019-07-24 MED ORDER — CEPHALEXIN 500 MG PO CAPS: 500.0000 mg | ORAL_CAPSULE | Freq: Two times a day (BID) | ORAL | 0 refills | Status: AC

## 2019-08-01 ENCOUNTER — Other Ambulatory Visit: Payer: Self-pay

## 2019-08-01 ENCOUNTER — Ambulatory Visit (INDEPENDENT_AMBULATORY_CARE_PROVIDER_SITE_OTHER): Payer: 59 | Admitting: Obstetrics and Gynecology

## 2019-08-01 ENCOUNTER — Other Ambulatory Visit: Payer: 59

## 2019-08-01 ENCOUNTER — Encounter: Payer: Self-pay | Admitting: Obstetrics and Gynecology

## 2019-08-01 VITALS — BP 120/80 | Ht 61.0 in | Wt 189.0 lb

## 2019-08-01 DIAGNOSIS — O039 Complete or unspecified spontaneous abortion without complication: Secondary | ICD-10-CM

## 2019-08-01 NOTE — Progress Notes (Signed)
Obstetric Problem Visit    Chief Complaint:  Chief Complaint  Patient presents with   Follow-up    spotting    History of Present Illness: Patient is a 33 y.o. G1P0000 [redacted]w[redacted]d presenting for first trimester bleeding.  The onset of bleeding was over the weekend.  Is bleeding equal to or greater than normal menstrual flow:  Yes Any recent trauma:  No History of prior miscarriage:  No Prior ultrasound demonstrating IUP: none.  Prior ultrasound demonstrating viable IUP:  No Prior Serum HCG:  No Rh status: positive  Patient began experiencing heavy vaginal bleeding and cramping while [place] the beach at [facility].  Was seen in the emergency department, no ultrasound obtained at that time.  She reports bleeding has since subsided.    Review of Systems: Review of Systems  Constitutional: Negative.   Gastrointestinal: Negative for abdominal pain.  Genitourinary: Negative.     Past Medical History:  Past Medical History:  Diagnosis Date   Allergy    Asthma    Interstitial cystitis    Seizures ([place])     Past Surgical History:  Past Surgical History:  Procedure Laterality Date   LAPAROSCOPY  2010   CYSTICS.   URETEROSCOPY  2010   WISDOM TOOTH EXTRACTION     X 3    Obstetric History: G1P0000  Family History:  Family History  Problem Relation Age of Onset   Multiple sclerosis Mother    Cancer Maternal Grandmother        BREAST.   Cancer Maternal Grandfather        THYRIOD.   Cancer Paternal Grandmother        LUNG   Stroke Paternal Grandfather    Heart disease Paternal Grandfather        HEART ATTACK.    Social History:  Social History   Socioeconomic History   Marital status: Single    Spouse name: Not on file   Number of children: Not on file   Years of education: Not on file   Highest education level: Not on file  Occupational History   Not on file  Social Needs   Financial resource strain: Not on file   Food insecurity   Worry: Not on file    Inability: Not on file   Transportation needs    Medical: Not on file    Non-medical: Not on file  Tobacco Use   Smoking status: Never Smoker   Smokeless tobacco: Never Used  Substance and Sexual Activity   Alcohol use: Yes    Alcohol/week: 0.0 standard drinks    Comment: occassionally   Drug use: No   Sexual activity: Yes    Partners: Male    Birth control/protection: None  Lifestyle   Physical activity    Days per week: Not on file    Minutes per session: Not on file   Stress: Not on file  Relationships   Social connections    Talks on phone: Not on file    Gets together: Not on file    Attends religious service: Not on file    Active member of club or organization: Not on file    Attends meetings of clubs or organizations: Not on file    Relationship status: Not on file   Intimate partner violence    Fear of current or ex partner: Not on file    Emotionally abused: Not on file    Physically abused: Not on file    Forced  sexual activity: Not on file  Other Topics Concern   Not on file  Social History Narrative   Not on file    Allergies:  Allergies  Allergen Reactions   Demerol [Meperidine]     seizure   Meperidine Hcl Other (See Comments)   Diphenhydramine    Sulfa Antibiotics Hives    Medications: Prior to Admission medications   Medication Sig Start Date End Date Taking? Authorizing Provider  albuterol (PROAIR HFA) 108 (90 BASE) MCG/ACT inhaler Inhale 2 puffs into the lungs every 4 (four) hours as needed for wheezing or shortness of breath. 10/03/15  Yes Juanito Doom, MD  budesonide-formoterol (SYMBICORT) 80-4.5 MCG/ACT inhaler Inhale 80 mcg into the lungs 2 (two) times daily. 03/17/15  Yes [provider]  fluticasone (FLONASE) 50 MCG/ACT nasal spray fluticasone propionate 50 mcg/actuation nasal spray,suspension 01/02/18  Yes [provider]  montelukast (SINGULAIR) 10 MG tablet TAKE 1 TABLET BY  MOUTH EVERY DAY EVERY NIGHT 10/30/18  Yes [provider]  pentosan polysulfate (ELMIRON) 100 MG capsule Take 100 mg by mouth daily.   Yes [provider]  Spacer/Aero-Holding Chambers (AEROCHAMBER MV) inhaler Use as instructed 05/29/15  Yes Juanito Doom, MD  Doxylamine-Pyridoxine (DICLEGIS) 10-10 MG TBEC Take 2 tablets by mouth at bedtime. If symptoms persist, add one tablet in the morning and one in the afternoon Patient not taking: Reported on 08/01/2019 07/18/19   Malachy Mood, MD  ibuprofen (ADVIL,MOTRIN) 800 MG tablet ibuprofen 800 mg tablet    [provider]  metoCLOPramide (REGLAN) 10 MG tablet  11/11/18   [provider]  Norethindrone-Ethinyl Estradiol-Fe Biphas (LO LOESTRIN FE) 1 MG-10 MCG / 10 MCG tablet Take 1 tablet by mouth daily. 08/19/17 11/11/17  Malachy Mood, MD  promethazine (PHENERGAN) 25 MG tablet Take 1 tablet (25 mg total) by mouth every 6 (six) hours as needed for nausea. 07/20/19   Rod Can, CNM    Physical Exam Vitals: Blood pressure 120/80, height 5\' 1"  (1.549 m), weight 189 lb (85.7 kg), last menstrual period 06/03/2019.  General: NAD HEENT: normocephalic, anicteric Pulmonary: No increased work of breathing, Extremities: no edema, erythema, or tenderness Neurologic: Grossly intact Psychiatric: mood appropriate, affect full  Bedside TVUS ultrasound reveals normal appearing uterus, no adnexal masses, no free fluid, and EMS of 7.10mm.    Female chaperone present for TVUS.  Assessment: 33 y.o. G1P0000 [redacted]w[redacted]d presenting for evaluation of first trimester vaginal bleeding  Plan: Problem List Items Addressed This Visit    None    Visit Diagnoses    Complete abortion    -  Primary      1) Condolences were offered to the patient and her family.  I stressed that while emotionally difficult, that this did not occur because of an actions or inactions by the patient.  Somewhere between 10-20% of identified first  trimester pregnancies will unfortunately end in miscarriage.  Given this relatively high incidence rate, further diagnostic testing such as chromosome analysis is generally not clinically relevant nor recommended.  Although the chromosomal abnormalities have been implicated at rates as high as 70% in some studies, these are generally random and do not infer and increased risk of recurrence with subsequent pregnancies.  However, 3 or more consecutive first trimester losses are relatively uncommon, and these patient generally do benefit from additional work up to determine a potential modifiable etiology.   We briefly discussed management options including expectant management, medical management, and surgical management as well as their relative  success rates and complications. Approximately 80% of first trimester miscarriages will pass successfully but may require a time frame of up to 8 weeks ([place] 150 May 2015 "Early Pregnancy Loss").  Medical management using 86mcg of misoprostil administered every 3-hrs as needed for up to 3 doses speeds up the time frame to completion significantly, has literature supporting its use up to 63 days or [redacted]w[redacted]d gestation and results in a passage rate of 84-85% (ACOG Practice Bulletin 143 March 2014 "Medical Management of First-Trimester Abortion").  Dilation and curettage has the highest rate of uterine evacuation, but carries with is operative cost, surgical and anesthetic risk.  While these risk are relatively small they nevertheless include infection, bleeding, uterine perforation, formation of uterine synechia, and in rare cases death.  Ultrasound today reveals a EMS of 7.69mm no intrauterine gestation visualized.  The patient is Rh positive and rhogam is therefore not indicated.     Malachy Mood, MD, [facility], [facility] 08/01/2019, 2:21 PM

## 2019-08-22 ENCOUNTER — Other Ambulatory Visit: Payer: Self-pay

## 2019-08-22 ENCOUNTER — Ambulatory Visit (INDEPENDENT_AMBULATORY_CARE_PROVIDER_SITE_OTHER): Payer: 59 | Admitting: Obstetrics and Gynecology

## 2019-08-22 ENCOUNTER — Encounter: Payer: Self-pay | Admitting: Obstetrics and Gynecology

## 2019-08-22 VITALS — BP 121/74 | HR 71 | Wt 189.0 lb

## 2019-08-22 DIAGNOSIS — O039 Complete or unspecified spontaneous abortion without complication: Secondary | ICD-10-CM | POA: Diagnosis not present

## 2019-08-22 NOTE — Progress Notes (Signed)
Obstetrics & Gynecology Office Visit   Chief Complaint:  Chief Complaint  Patient presents with  . Follow-up    SAB/not bleeding currently    History of Present Illness: 33 year old G1P0010 presenting for follow up of spontaneous and complete first trimester abortion.  She states she has stopped bleeding since her last visit.  She has not had a normal menstrual cycle.  While this was not a planned pregnancy it was a desired pregnancy.  Is interested in conceiving again soon.  Doing well from a mood standpoint.  Has good days and bad days.  We talked at length today about follow up with next pregnancy as well as the fact that one miscarriage does not increase her risk in subsequent pregnancies.     Review of Systems: Review of systems negative unless otherwise noted in HPI  Past Medical History:  Past Medical History:  Diagnosis Date  . Allergy   . Asthma   . Interstitial cystitis   . Seizures ([place])     Past Surgical History:  Past Surgical History:  Procedure Laterality Date  . LAPAROSCOPY  2010   CYSTICS.  Marland Kitchen URETEROSCOPY  2010  . WISDOM TOOTH EXTRACTION     X 3    Gynecologic History: Patient's last menstrual period was 06/03/2019 (exact date).  Obstetric History: G1P0000  Family History:  Family History  Problem Relation Age of Onset  . Multiple sclerosis Mother   . Cancer Maternal Grandmother        BREAST.  Marland Kitchen Cancer Maternal Grandfather        THYRIOD.  Marland Kitchen Cancer Paternal Grandmother        LUNG  . Stroke Paternal Grandfather   . Heart disease Paternal Grandfather        HEART ATTACK.    Social History:  Social History   Socioeconomic History  . Marital status: Single    Spouse name: Not on file  . Number of children: Not on file  . Years of education: Not on file  . Highest education level: Not on file  Occupational History  . Not on file  Social Needs  . Financial resource strain: Not on file  . Food insecurity    Worry: Not on file   Inability: Not on file  . Transportation needs    Medical: Not on file    Non-medical: Not on file  Tobacco Use  . Smoking status: Never Smoker  . Smokeless tobacco: Never Used  Substance and Sexual Activity  . Alcohol use: Yes    Alcohol/week: 0.0 standard drinks    Comment: occassionally  . Drug use: No  . Sexual activity: Yes    Partners: Male    Birth control/protection: None  Lifestyle  . Physical activity    Days per week: Not on file    Minutes per session: Not on file  . Stress: Not on file  Relationships  . Social Herbalist on phone: Not on file    Gets together: Not on file    Attends religious service: Not on file    Active member of club or organization: Not on file    Attends meetings of clubs or organizations: Not on file    Relationship status: Not on file  . Intimate partner violence    Fear of current or ex partner: Not on file    Emotionally abused: Not on file    Physically abused: Not on file  Forced sexual activity: Not on file  Other Topics Concern  . Not on file  Social History Narrative  . Not on file    Allergies:  Allergies  Allergen Reactions  . Demerol [Meperidine]     seizure  . Meperidine Hcl Other (See Comments)  . Diphenhydramine   . Sulfa Antibiotics Hives    Medications: Prior to Admission medications   Medication Sig Start Date End Date Taking? Authorizing Provider  albuterol (PROAIR HFA) 108 (90 BASE) MCG/ACT inhaler Inhale 2 puffs into the lungs every 4 (four) hours as needed for wheezing or shortness of breath. 10/03/15   Juanito Doom, MD  budesonide-formoterol (SYMBICORT) 80-4.5 MCG/ACT inhaler Inhale 80 mcg into the lungs 2 (two) times daily. 03/17/15   [provider]  Doxylamine-Pyridoxine (DICLEGIS) 10-10 MG TBEC Take 2 tablets by mouth at bedtime. If symptoms persist, add one tablet in the morning and one in the afternoon Patient not taking: Reported on 08/01/2019 07/18/19   Malachy Mood, MD   fluticasone [facility]) 50 MCG/ACT nasal spray fluticasone propionate 50 mcg/actuation nasal spray,suspension 01/02/18   [provider]  ibuprofen (ADVIL,MOTRIN) 800 MG tablet ibuprofen 800 mg tablet    [provider]  metoCLOPramide (REGLAN) 10 MG tablet  11/11/18   [provider]  montelukast (SINGULAIR) 10 MG tablet TAKE 1 TABLET BY MOUTH EVERY DAY EVERY NIGHT 10/30/18   [provider]  Norethindrone-Ethinyl Estradiol-Fe Biphas (LO LOESTRIN FE) 1 MG-10 MCG / 10 MCG tablet Take 1 tablet by mouth daily. 08/19/17 11/11/17  Malachy Mood, MD  pentosan polysulfate (ELMIRON) 100 MG capsule Take 100 mg by mouth daily.    [provider]  promethazine (PHENERGAN) 25 MG tablet Take 1 tablet (25 mg total) by mouth every 6 (six) hours as needed for nausea. 07/20/19   Rod Can, CNM  Spacer/Aero-Holding Chambers (AEROCHAMBER MV) inhaler Use as instructed 05/29/15   Juanito Doom, MD    Physical Exam Vitals:  Vitals:   08/22/19 0912  BP: 121/74  Pulse: 71   Patient's last menstrual period was 06/03/2019 (exact date).  General: NAD, well nourished, appears stated age 65: normocephalic, anicteric Pulmonary: No increased work of breathing Extremities: no edema, erythema, or tenderness Neurologic: Grossly intact Psychiatric: mood appropriate, affect full  Assessment: 33 y.o. G1P0010 presenting for follow up of SAB  Plan: Problem List Items Addressed This Visit    None    Visit Diagnoses    Complete abortion    -  Primary      Condolences were offered to the patient and her family.  I stressed that while emotionally difficult, that this did not occur because of an actions or inactions by the patient.  Somewhere between 10-20% of identified first trimester pregnancies will unfortunately end in miscarriage.  Given this relatively high incidence rate, further diagnostic testing such as chromosome analysis is generally not clinically  relevant nor recommended.  Although the chromosomal abnormalities have been implicated at rates as high as 70% in some studies, these are generally random and do not infer and increased risk of recurrence with subsequent pregnancies.     PRISM trial was a multicenter, double-blinded, randomized control trial conducted in the [place] looking at the effects of progesterone supplementation in women with threatened miscarriage. Women were adminstered 400mg  of vaginal progesterone twice daily from onset of bleeding through [redacted] weeks gestation.  The trial included 70 women.  There was no significant improvement in livebirth rates demonstrated in the overall cohort (delivery  after 34 weeks).  However in women with a history of one prior miscarriage progesterone group 76% resulted in liveborn pregnancy vs 72% for placebo (RR 1.05 95% CI 1.00 to 1.12).  For women with three or more first trimester miscarriages the effect was more pronounced with 72% liveborn pregnancy rate vs 57% for placebo (RR 1.28 95% CI 1.08 to 1.151).  "A Randomized Trial of Progesterone in Women with Bleeding in Early Pregnancy" Tammi Sou, MD et al NEJM 2019, [id number]  At present there is no evidence to support the use of vaginal progesterone in women with a history of unexplained recurrent first trimester miscarriage in an attempt to minimize risk of miscarriage in a subsequent pregnancy.  Its use did not show a clinically significant increase in live birth rate.  However, there were also no identified adverse events to its use.  This data was shared with the patient.    "A Randomized Trial of Progesterone in Women with Recurrent Miscarriages" NEJM 373;22 November 21, 2014   Several studies have examined and established a role for emotional support and stress reduction in improving pregnancy outcomes for patient with recurrent miscarriage. The tender love and care model utilizes weekly to twice weekly follow up, more frequent  ultrasound, and has consistently demonstrated improved live birth rates in several studies.  The risk of miscarriage following documentation of fetal heart tones drops significantly to approximately 3%.  A total of 15 minutes were spent in face-to-face contact with the patient during this encounter with over half of that time devoted to counseling and coordination of care.   Return in about 4 months (around 12/22/2019) for follow up.   Malachy Mood, MD, [facility], [place]

## 2019-12-13 DIAGNOSIS — Z91012 Allergy to eggs: Secondary | ICD-10-CM | POA: Insufficient documentation

## 2019-12-26 ENCOUNTER — Telehealth (INDEPENDENT_AMBULATORY_CARE_PROVIDER_SITE_OTHER): Payer: 59 | Admitting: Obstetrics and Gynecology

## 2019-12-26 ENCOUNTER — Other Ambulatory Visit: Payer: Self-pay

## 2019-12-26 DIAGNOSIS — Z5329 Procedure and treatment not carried out because of patient's decision for other reasons: Secondary | ICD-10-CM

## 2019-12-26 NOTE — Progress Notes (Signed)
Did not join video call. Provider signed on 08:07, signed off 08:27

## 2020-06-20 ENCOUNTER — Other Ambulatory Visit: Payer: Self-pay | Admitting: Physician Assistant

## 2020-06-20 DIAGNOSIS — H9209 Otalgia, unspecified ear: Secondary | ICD-10-CM

## 2020-07-03 ENCOUNTER — Ambulatory Visit: Payer: 59

## 2020-07-08 ENCOUNTER — Ambulatory Visit: Payer: 59 | Admitting: Obstetrics and Gynecology

## 2020-07-25 ENCOUNTER — Other Ambulatory Visit (HOSPITAL_COMMUNITY)
Admission: RE | Admit: 2020-07-25 | Discharge: 2020-07-25 | Disposition: A | Discharge status: Home / Self Care | Payer: 59 | Source: Ambulatory Visit | Attending: Obstetrics and Gynecology | Admitting: Obstetrics and Gynecology

## 2020-07-25 ENCOUNTER — Ambulatory Visit (INDEPENDENT_AMBULATORY_CARE_PROVIDER_SITE_OTHER): Payer: 59 | Admitting: Obstetrics and Gynecology

## 2020-07-25 ENCOUNTER — Other Ambulatory Visit: Payer: Self-pay

## 2020-07-25 ENCOUNTER — Encounter: Payer: Self-pay | Admitting: Obstetrics and Gynecology

## 2020-07-25 VITALS — BP 130/66 | HR 83 | Ht 61.0 in | Wt 198.0 lb

## 2020-07-25 DIAGNOSIS — Z1239 Encounter for other screening for malignant neoplasm of breast: Secondary | ICD-10-CM

## 2020-07-25 DIAGNOSIS — Z124 Encounter for screening for malignant neoplasm of cervix: Secondary | ICD-10-CM | POA: Diagnosis not present

## 2020-07-25 DIAGNOSIS — Z01419 Encounter for gynecological examination (general) (routine) without abnormal findings: Secondary | ICD-10-CM | POA: Diagnosis not present

## 2020-07-25 NOTE — Progress Notes (Signed)
Gynecology Annual Exam   PCP: System, Pcp Not In  Chief Complaint:  Chief Complaint  Patient presents with  . Gynecologic Exam    Bled 2x this month    History of Present Illness: Patient is a 34 y.o. G1P0000 presents for annual exam. The patient has no complaints today.   LMP: Patient's last menstrual period was 07/09/2020 (exact date). Regular monthly  The patient is sexually active. She currently uses none for contraception. She denies dyspareunia.  The patient does perform self breast exams.  There is no notable family history of breast or ovarian cancer in her family.  The patient wears seatbelts: yes.   The patient has regular exercise: not asked.    The patient denies current symptoms of depression.    Review of Systems: Review of Systems  Constitutional: Negative for chills and fever.  HENT: Negative for congestion.   Respiratory: Negative for cough and shortness of breath.   Cardiovascular: Negative for chest pain and palpitations.  Gastrointestinal: Negative for abdominal pain, constipation, diarrhea, heartburn, nausea and vomiting.  Genitourinary: Negative for dysuria, frequency and urgency.  Skin: Negative for itching and rash.  Neurological: Negative for dizziness and headaches.  Endo/Heme/Allergies: Negative for polydipsia.  Psychiatric/Behavioral: Negative for depression.    Past Medical History:  Patient Active Problem List   Diagnosis Date Noted  . GBS bacteriuria 07/24/2019  . Extrinsic asthma 05/15/2015    05/20/2015 6 minute walk: Walked 1500 feet on room air O2 saturation 96-98% 05/20/2015 full pulmonary function testing ratio 79%, FEV1 3.03 L (110% predicted), total lung capacity 4.63 L (102% predicted), DLCO 19.6 (80% predicted) 05/20/2015 methacholine challenge positive at level II   . Dyspnea 05/15/2015    Past Surgical History:  Past Surgical History:  Procedure Laterality Date  . LAPAROSCOPY  2010   CYSTICS.  Marland Kitchen URETEROSCOPY  2010    . WISDOM TOOTH EXTRACTION     X 3    Gynecologic History:  Patient's last menstrual period was 07/09/2020 (exact date). Contraception: none Last Pap: Results were:08/19/2017 NIL and HR HPV negative   Obstetric History: G1P0000  Family History:  Family History  Problem Relation Age of Onset  . Multiple sclerosis Mother   . Cancer Maternal Grandmother        BREAST.  Marland Kitchen Cancer Maternal Grandfather        THYRIOD.  Marland Kitchen Cancer Paternal Grandmother        LUNG  . Stroke Paternal Grandfather   . Heart disease Paternal Grandfather        HEART ATTACK.    Social History:  Social History   Socioeconomic History  . Marital status: Single    Spouse name: Not on file  . Number of children: Not on file  . Years of education: Not on file  . Highest education level: Not on file  Occupational History  . Not on file  Tobacco Use  . Smoking status: Never Smoker  . Smokeless tobacco: Never Used  Vaping Use  . Vaping Use: Never used  Substance and Sexual Activity  . Alcohol use: Yes    Alcohol/week: 0.0 standard drinks    Comment: occassionally  . Drug use: No  . Sexual activity: Yes    Partners: Male    Birth control/protection: None  Other Topics Concern  . Not on file  Social History Narrative  . Not on file   Social Determinants of Health   Financial Resource Strain:   . Difficulty of  Paying Living Expenses:   Food Insecurity:   . Worried About Charity fundraiser in the Last Year:   . Arboriculturist in the Last Year:   Transportation Needs:   . Film/video editor (Medical):   Marland Kitchen Lack of Transportation (Non-Medical):   Physical Activity:   . Days of Exercise per Week:   . Minutes of Exercise per Session:   Stress:   . Feeling of Stress :   Social Connections:   . Frequency of Communication with Friends and Family:   . Frequency of Social Gatherings with Friends and Family:   . Attends Religious Services:   . Active Member of Clubs or Organizations:   .  Attends Archivist Meetings:   Marland Kitchen Marital Status:   Intimate Partner Violence:   . Fear of Current or Ex-Partner:   . Emotionally Abused:   Marland Kitchen Physically Abused:   . Sexually Abused:     Allergies:  Allergies  Allergen Reactions  . Demerol [Meperidine]     seizure  . Meperidine Hcl Other (See Comments)  . Diphenhydramine   . Sulfa Antibiotics Hives    Medications: Prior to Admission medications   Medication Sig Start Date End Date Taking? Authorizing Provider  albuterol (PROAIR HFA) 108 (90 BASE) MCG/ACT inhaler Inhale 2 puffs into the lungs every 4 (four) hours as needed for wheezing or shortness of breath. 10/03/15  Yes Juanito Doom, MD  budesonide-formoterol (SYMBICORT) 80-4.5 MCG/ACT inhaler Inhale 80 mcg into the lungs 2 (two) times daily. 03/17/15  Yes [provider]  fluticasone (FLONASE) 50 MCG/ACT nasal spray fluticasone propionate 50 mcg/actuation nasal spray,suspension 01/02/18  Yes [provider]  montelukast (SINGULAIR) 10 MG tablet TAKE 1 TABLET BY MOUTH EVERY DAY EVERY NIGHT 10/30/18  Yes [provider]  pentosan polysulfate (ELMIRON) 100 MG capsule Take 100 mg by mouth daily.   Yes [provider]  promethazine (PHENERGAN) 25 MG tablet Take 1 tablet (25 mg total) by mouth every 6 (six) hours as needed for nausea. 07/20/19  Yes Rod Can, CNM  Spacer/Aero-Holding Chambers (AEROCHAMBER MV) inhaler Use as instructed 05/29/15  Yes Juanito Doom, MD  Doxylamine-Pyridoxine (DICLEGIS) 10-10 MG TBEC Take 2 tablets by mouth at bedtime. If symptoms persist, add one tablet in the morning and one in the afternoon Patient not taking: Reported on 08/01/2019 07/18/19   Malachy Mood, MD  ibuprofen (ADVIL,MOTRIN) 800 MG tablet ibuprofen 800 mg tablet    [provider]  metoCLOPramide (REGLAN) 10 MG tablet  11/11/18   [provider]  Norethindrone-Ethinyl Estradiol-Fe Biphas (LO LOESTRIN FE) 1 MG-10 MCG / 10  MCG tablet Take 1 tablet by mouth daily. 08/19/17 11/11/17  Malachy Mood, MD    Physical Exam Vitals: Blood pressure (!) 130/66, pulse 83, height 5\' 1"  (1.549 m), weight 198 lb (89.8 kg), last menstrual period 07/09/2020, not currently breastfeeding.  General: NAD HEENT: normocephalic, anicteric Thyroid: no enlargement, no palpable nodules Pulmonary: No increased work of breathing, CTAB Cardiovascular: RRR, distal pulses 2+ Breast: Breast symmetrical, no tenderness, no palpable nodules or masses, no skin or nipple retraction present, no nipple discharge.  No axillary or supraclavicular lymphadenopathy. Abdomen: NABS, soft, non-tender, non-distended.  Umbilicus without lesions.  No hepatomegaly, splenomegaly or masses palpable. No evidence of hernia  Genitourinary:  External: Normal external female genitalia.  Normal urethral meatus, normal Bartholin's and Skene's glands.    Vagina: Normal vaginal mucosa, no evidence of prolapse.    Cervix: Grossly  normal in appearance, no bleeding  Uterus: Non-enlarged, mobile, normal contour.  No CMT  Adnexa: ovaries non-enlarged, no adnexal masses  Rectal: deferred  Lymphatic: no evidence of inguinal lymphadenopathy Extremities: no edema, erythema, or tenderness Neurologic: Grossly intact Psychiatric: mood appropriate, affect full  Female chaperone present for pelvic and breast  portions of the physical exam    Assessment: 34 y.o. G1P0000 routine annual exam  Plan: Problem List Items Addressed This Visit    None    Visit Diagnoses    Encounter for gynecological examination without abnormal finding    -  Primary   Screening for malignant neoplasm of cervix       Relevant Orders   Cytology - PAP   Breast screening          2) STI screening  was notoffered and therefore not obtained  2)  ASCCP guidelines and rational discussed.  Patient opts for every 3 years screening interval  3) Contraception - the patient is currently using   none.  She is attempting to conceive in the near future  4) Routine healthcare maintenance including cholesterol, diabetes screening discussed managed by PCP  5) Return in about 1 year (around 07/25/2021) for annual.   Malachy Mood, MD, [place], [facility] 07/25/2020, 4:11 PM

## 2020-07-31 LAB — CYTOLOGY - PAP
Comment: NEGATIVE
Diagnosis: NEGATIVE
High risk HPV: NEGATIVE

## 2020-09-16 ENCOUNTER — Other Ambulatory Visit: Payer: Self-pay | Admitting: Obstetrics and Gynecology

## 2020-09-16 MED ORDER — FLUCONAZOLE 150 MG PO TABS: 150.0000 mg | ORAL_TABLET | Freq: Once | ORAL | 0 refills | Status: AC

## 2021-01-02 ENCOUNTER — Other Ambulatory Visit: Payer: Self-pay | Admitting: Advanced Practice Midwife

## 2021-01-02 DIAGNOSIS — B3731 Acute candidiasis of vulva and vagina: Secondary | ICD-10-CM

## 2021-01-02 DIAGNOSIS — B373 Candidiasis of vulva and vagina: Secondary | ICD-10-CM

## 2021-01-02 MED ORDER — FLUCONAZOLE 150 MG PO TABS: 150.0000 mg | ORAL_TABLET | Freq: Once | ORAL | 3 refills | Status: AC

## 2021-01-02 NOTE — Progress Notes (Signed)
Rx diflucan sent per patient request for yeast symptoms

## 2021-05-22 ENCOUNTER — Other Ambulatory Visit: Payer: Self-pay

## 2021-05-22 ENCOUNTER — Encounter: Payer: Self-pay | Admitting: Podiatry

## 2021-05-22 ENCOUNTER — Ambulatory Visit (INDEPENDENT_AMBULATORY_CARE_PROVIDER_SITE_OTHER): Payer: 59 | Admitting: Podiatry

## 2021-05-22 DIAGNOSIS — L600 Ingrowing nail: Secondary | ICD-10-CM

## 2021-05-22 NOTE — Patient Instructions (Signed)

## 2021-05-22 NOTE — Progress Notes (Signed)
   HPI: 35 y.o. female presenting today as a new patient for evaluation of pain and sensitivity associated to the left hallux nail plate.  Patient states that about 2 years ago she kicked her foot against some cardboard boxes when moving and she began to have pain and tenderness about that time.  She has not done anything for treatment.  She presents for further treatment and evaluation  Past Medical History:  Diagnosis Date  . Allergy   . Asthma   . Interstitial cystitis   . Seizures [facility])      Physical Exam: General: The patient is alert and oriented x3 in no acute distress.  Dermatology: Skin is warm, dry and supple bilateral lower extremities. Negative for open lesions or macerations.  There does appear to be an old nail plate growing on top of new nail growth.  The new nail growth extends about halfway down the nailbed.  There is some incurvated nail also to the medial border of the left hallux nail plate with associated tenderness palpation.  Vascular: Palpable pedal pulses bilaterally. No edema or erythema noted. Capillary refill within normal limits.  Neurological: Epicritic and protective threshold grossly intact bilaterally.   Musculoskeletal Exam: No pedal deformities noted Assessment: 1.  Ingrown toenail medial border left great toe with an old nail plate growing on top of the new nail growth   Plan of Care:  1. Patient evaluated.  2.  After discussing the different treatment options with the patient, recommend total temporary nail avulsion of the old overlying nail plate as well as temporary nail avulsion to the medial border of the left hallux nail plate.  Patient agrees.  The procedure was explained in detail with all questions answered. 3.  The toe was prepped in aseptic manner and digital block was performed using 3 mL of lidocaine plain 2%.  The old nail plate overlying the new nail growth was completely avulsed and the medial border of the left hallux nail was avulsed  back to the nail root. 4.  Light dressing applied.  Post care instructions provided 5.  Return to clinic in 2 weeks      Edrick Kins, DPM [facility]  Dr. Edrick Kins, DPM    [address], [place] [address]                Office [phone]  Fax [phone]

## 2021-05-28 ENCOUNTER — Encounter: Payer: Self-pay | Admitting: Podiatry

## 2021-06-03 ENCOUNTER — Encounter: Payer: Self-pay | Admitting: Obstetrics and Gynecology

## 2021-06-03 ENCOUNTER — Other Ambulatory Visit: Payer: Self-pay

## 2021-06-03 ENCOUNTER — Ambulatory Visit (INDEPENDENT_AMBULATORY_CARE_PROVIDER_SITE_OTHER): Payer: 59 | Admitting: Obstetrics and Gynecology

## 2021-06-03 VITALS — BP 136/74 | Wt 211.0 lb

## 2021-06-03 DIAGNOSIS — Z3169 Encounter for other general counseling and advice on procreation: Secondary | ICD-10-CM

## 2021-06-03 DIAGNOSIS — L650 Telogen effluvium: Secondary | ICD-10-CM | POA: Diagnosis not present

## 2021-06-03 DIAGNOSIS — N939 Abnormal uterine and vaginal bleeding, unspecified: Secondary | ICD-10-CM

## 2021-06-03 DIAGNOSIS — N946 Dysmenorrhea, unspecified: Secondary | ICD-10-CM | POA: Diagnosis not present

## 2021-06-04 ENCOUNTER — Other Ambulatory Visit: Payer: 59

## 2021-06-05 ENCOUNTER — Other Ambulatory Visit: Payer: Self-pay

## 2021-06-05 ENCOUNTER — Encounter: Payer: Self-pay | Admitting: Podiatry

## 2021-06-05 ENCOUNTER — Ambulatory Visit (INDEPENDENT_AMBULATORY_CARE_PROVIDER_SITE_OTHER): Payer: 59 | Admitting: Podiatry

## 2021-06-05 DIAGNOSIS — L600 Ingrowing nail: Secondary | ICD-10-CM | POA: Diagnosis not present

## 2021-06-05 NOTE — Progress Notes (Signed)
   Subjective: 35 y.o. female presents today status post total temporary nail avulsion procedure of the left great toe that was performed on 05/22/2021.  Patient states that she feels much better.  She has been applying antibiotic ointment as instructed.  She is also been soaking her foot.  No new complaints at this time  Past Medical History:  Diagnosis Date   Allergy    Asthma    Interstitial cystitis    Seizures ([place])     Objective: Skin is warm, dry and supple. Nail and respective nail fold appears to be healing appropriately. Open wound to the associated nail fold with a granular wound base and moderate amount of fibrotic tissue which demonstrates good healing. Minimal drainage noted.  Negative for any significant erythema or edema Assessment: #1 postop total temporary nail avulsion left great toe   Plan of care: #1 patient was evaluated  #2  Light debridement of open wound was performed to the periungual border of the respective toe using a currette. Antibiotic ointment and Band-Aid was applied. #3 patient is to return to clinic on a PRN basis.   Edrick Kins, DPM [facility]  Dr. Edrick Kins, DPM    [address], [place] [address]                Office [phone]  Fax [phone]

## 2021-06-08 ENCOUNTER — Ambulatory Visit: Payer: 59 | Admitting: Dermatology

## 2021-06-08 NOTE — Progress Notes (Signed)
Obstetrics & Gynecology Office Visit   Chief Complaint:  Chief Complaint  Patient presents with   Metrorrhagia    Menstrual off and on several times in one month. RM 5    History of Present Illness: 35 y.o. G1P0000 who presents for evaluation of abnormal uterine bleeding.  She reports that in the past month she has noted a menstrual cycle earlier than expected on April 16th and lighter.  Following cessation of bleeding she had another cycle approximately a week later with prolonged bleeding for almost a month.  Prior to this regular monthly menstrual cycles.  Bleeding was spontaneous without inciting event.  No significant abdominal pain associated with bleeding.    Pap smear is up to date and was NILM HPV negative 07/25/2020.  She is not currently on any hormonal contraception as she is interested in conceiving.  She has a spontaneous first trimester abortion in August of 2020.  Review of Systems: Review of Systems  Constitutional: Negative.   Gastrointestinal: Negative.   Genitourinary: Negative.   Endo/Heme/Allergies:  Does not bruise/bleed easily.    Past Medical History:  Past Medical History:  Diagnosis Date   Allergy    Asthma    Interstitial cystitis    Seizures ([place])     Past Surgical History:  Past Surgical History:  Procedure Laterality Date   LAPAROSCOPY  2010   CYSTICS.   URETEROSCOPY  2010   WISDOM TOOTH EXTRACTION     X 3    Gynecologic History: Patient's last menstrual period was 05/12/2021.  Obstetric History: G1P0000  Family History:  Family History  Problem Relation Age of Onset   Multiple sclerosis Mother    Cancer Maternal Grandmother        BREAST.   Cancer Maternal Grandfather        THYRIOD.   Cancer Paternal Grandmother        LUNG   Stroke Paternal Grandfather    Heart disease Paternal Grandfather        HEART ATTACK.    Social History:  Social History   Socioeconomic History   Marital status: Single    Spouse name: Not  on file   Number of children: Not on file   Years of education: Not on file   Highest education level: Not on file  Occupational History   Not on file  Tobacco Use   Smoking status: Never   Smokeless tobacco: Never  Vaping Use   Vaping Use: Never used  Substance and Sexual Activity   Alcohol use: Yes    Alcohol/week: 0.0 standard drinks    Comment: occassionally   Drug use: No   Sexual activity: Yes    Partners: Male    Birth control/protection: None  Other Topics Concern   Not on file  Social History Narrative   Not on file   Social Determinants of Health   Financial Resource Strain: Not on file  Food Insecurity: Not on file  Transportation Needs: Not on file  Physical Activity: Not on file  Stress: Not on file  Social Connections: Not on file  Intimate Partner Violence: Not on file    Allergies:  Allergies  Allergen Reactions   Demerol [Meperidine]     seizure   Meperidine Hcl Other (See Comments)   Diphenhydramine    Sulfa Antibiotics Hives    Medications: Prior to Admission medications   Medication Sig Start Date End Date Taking? Authorizing Provider  Acetaminophen 500 MG capsule Take by  mouth.    [provider]  albuterol (PROAIR HFA) 108 (90 BASE) MCG/ACT inhaler Inhale 2 puffs into the lungs every 4 (four) hours as needed for wheezing or shortness of breath. 10/03/15   Juanito Doom, MD  budesonide-formoterol (SYMBICORT) 80-4.5 MCG/ACT inhaler Inhale 80 mcg into the lungs 2 (two) times daily. 03/17/15   [provider]  cyclobenzaprine (FLEXERIL) 10 MG tablet cyclobenzaprine 10 mg tablet    [provider]  fluticasone (FLONASE) 50 MCG/ACT nasal spray fluticasone propionate 50 mcg/actuation nasal spray,suspension 01/02/18   [provider]  LORazepam (ATIVAN) 0.5 MG tablet Take by mouth. 11/25/20   [provider]  methocarbamol (ROBAXIN) 500 MG tablet Take 1 tablet by mouth 3 (three) times daily. 11/28/20    [provider]  montelukast (SINGULAIR) 10 MG tablet TAKE 1 TABLET BY MOUTH EVERY DAY EVERY NIGHT 10/30/18   [provider]  Spacer/Aero-Holding Chambers (AEROCHAMBER MV) inhaler Use as instructed 05/29/15   Juanito Doom, MD    Physical Exam Vitals:  Vitals:   06/03/21 1557  BP: 136/74   Patient's last menstrual period was 05/12/2021.  General: NAD HEENT: normocephalic, anicteric Pulmonary: No increased work of breathing Neurologic: Grossly intact Psychiatric: mood appropriate, affect full  Assessment: 35 y.o. G1P0000 presenting for evaluation of change in menstrual cycle flow  Plan: Problem List Items Addressed This Visit   None Visit Diagnoses     Abnormal uterine bleeding    -  Primary   Relevant Orders   17-Hydroxyprogesterone   Androstenedione   DHEA-sulfate   FSH/LH   Testosterone,Free and Total   Prolactin   TSH   Encounter for preconception consultation       Relevant Orders   17-Hydroxyprogesterone   Androstenedione   DHEA-sulfate   FSH/LH   Testosterone,Free and Total   Prolactin   TSH   Dysmenorrhea       Relevant Orders   17-Hydroxyprogesterone   Androstenedione   DHEA-sulfate   FSH/LH   Testosterone,Free and Total   Prolactin   TSH   Telogen effluvium       Relevant Orders   TSH      1) AUB - also currently trying to conceive.  Will obtain basic PCOS labs as well as TVUS to see if any hormonal or structural lesions which may interfere with conception.  2) A total of 15 minutes were spent in face-to-face contact with the patient during this encounter with over half of that time devoted to counseling and coordination of care.  3) Return in about 1 day (around 06/04/2021), or if symptoms worsen or fail to improve, for lab work tomorrow or Friday.   Malachy Mood, MD, [facility], [facility] 06/08/2021, 9:04 PM

## 2021-06-10 LAB — ANDROSTENEDIONE: Androstenedione: 58 ng/dL (ref 41–262)

## 2021-06-10 LAB — TESTOSTERONE,FREE AND TOTAL
Testosterone, Free: 1 pg/mL (ref 0.0–4.2)
Testosterone: 11 ng/dL (ref 8–60)

## 2021-06-10 LAB — DHEA-SULFATE: DHEA-SO4: 174 ug/dL (ref 57.3–279.2)

## 2021-06-10 LAB — FSH/LH
FSH: 7.1 m[IU]/mL
LH: 10.5 m[IU]/mL

## 2021-06-10 LAB — 17-HYDROXYPROGESTERONE: 17-Hydroxyprogesterone: 54 ng/dL

## 2021-06-10 LAB — TSH: TSH: 1 u[IU]/mL (ref 0.450–4.500)

## 2021-06-10 LAB — PROLACTIN: Prolactin: 19.7 ng/mL (ref 4.8–23.3)

## 2021-07-20 ENCOUNTER — Ambulatory Visit (INDEPENDENT_AMBULATORY_CARE_PROVIDER_SITE_OTHER): Payer: 59 | Admitting: Dermatology

## 2021-07-20 ENCOUNTER — Other Ambulatory Visit: Payer: Self-pay

## 2021-07-20 ENCOUNTER — Ambulatory Visit: Payer: 59 | Admitting: Dermatology

## 2021-07-20 DIAGNOSIS — D229 Melanocytic nevi, unspecified: Secondary | ICD-10-CM

## 2021-07-20 DIAGNOSIS — L858 Other specified epidermal thickening: Secondary | ICD-10-CM

## 2021-07-20 DIAGNOSIS — D2272 Melanocytic nevi of left lower limb, including hip: Secondary | ICD-10-CM | POA: Diagnosis not present

## 2021-07-20 DIAGNOSIS — L820 Inflamed seborrheic keratosis: Secondary | ICD-10-CM | POA: Diagnosis not present

## 2021-07-20 DIAGNOSIS — D1800 Hemangioma unspecified site: Secondary | ICD-10-CM

## 2021-07-20 DIAGNOSIS — Z1283 Encounter for screening for malignant neoplasm of skin: Secondary | ICD-10-CM

## 2021-07-20 DIAGNOSIS — L821 Other seborrheic keratosis: Secondary | ICD-10-CM

## 2021-07-20 DIAGNOSIS — L578 Other skin changes due to chronic exposure to nonionizing radiation: Secondary | ICD-10-CM

## 2021-07-20 DIAGNOSIS — L719 Rosacea, unspecified: Secondary | ICD-10-CM

## 2021-07-20 DIAGNOSIS — L814 Other melanin hyperpigmentation: Secondary | ICD-10-CM

## 2021-07-20 NOTE — Patient Instructions (Addendum)
May apply AmLactin Rapid Relief to upper arms  Instructions for [facility] Medications  One or more of your medications was sent to the [facility] mail order compounding pharmacy. You will receive an email from them and can purchase the medicine through that link. It will then be mailed to your home at the address you confirmed. If for any reason you do not receive an email from them, please check your spam folder. If you still do not find the email, please let us know. [facility] phone number is [phone].   Cryotherapy Aftercare  Wash gently with soap and water everyday.   Apply Vaseline and Band-Aid daily until healed.    If you have any questions or concerns for your doctor, please call our main line at [phone] and press option 4 to reach your doctor's medical assistant. If no one answers, please leave a voicemail as directed and we will return your call as soon as possible. Messages left after 4 pm will be answered the following business day.   You may also send [place] a message via [place]. We typically respond to MyChart messages within 1-2 business days.  For prescription refills, please ask your pharmacy to contact our office. Our fax number is [phone].  If you have an urgent issue when the clinic is closed that cannot wait until the next business day, you can page your doctor at the number below.    Please note that while we do our best to be available for urgent issues outside of office hours, we are not available 24/7.   If you have an urgent issue and are unable to reach [place], you may choose to seek medical care at your doctor's office, retail clinic, urgent care center, or emergency room.  If you have a medical emergency, please immediately call 911 or go to the emergency department.  Pager Numbers  - Dr. Nehemiah Massed: [phone]  - Dr. Laurence Ferrari: [phone]  - Dr. Nicole Kindred: [phone]  In the event of inclement weather, please call our main line at  [phone] for an update on the status of any delays or closures.  Dermatology Medication Tips: Please keep the boxes that topical medications come in in order to help keep track of the instructions about where and how to use these. Pharmacies typically print the medication instructions only on the boxes and not directly on the medication tubes.   If your medication is too expensive, please contact our office at [phone] option 4 or send [place] a message through [place].   We are unable to tell what your co-pay for medications will be in advance as this is different depending on your insurance coverage. However, we may be able to find a substitute medication at lower cost or fill out paperwork to get insurance to cover a needed medication.   If a prior authorization is required to get your medication covered by your insurance company, please allow [place] 1-2 business days to complete this process.  Drug prices often vary depending on where the prescription is filled and some pharmacies may offer cheaper prices.  The website www.goodrx.com contains coupons for medications through different pharmacies. The prices here do not account for what the cost may be with help from insurance (it may be cheaper with your insurance), but the website can give you the price if you did not use any insurance.  - You can print the associated coupon and take it with your prescription to the pharmacy.  - You may also  stop by our office during regular business hours and pick up a GoodRx coupon card.  - If you need your prescription sent electronically to a different pharmacy, notify our office through [facility] or by phone at [phone] option 4.

## 2021-07-20 NOTE — Progress Notes (Signed)
Follow-Up Visit   Subjective  Pamela Dillon is a 35 y.o. female who presents for the following: Other (Moles of neck that have been treated in the past with LN2. Desires TBSE today.). The patient presents for Total-Body Skin Exam (TBSE) for skin cancer screening and mole check.  The following portions of the chart were reviewed this encounter and updated as appropriate:   Tobacco  Allergies  Meds  Problems  Med Hx  Surg Hx  Fam Hx     Review of Systems:  No other skin or systemic complaints except as noted in HPI or Assessment and Plan.  Objective  Well appearing patient in no apparent distress; mood and affect are within normal limits.  A full examination was performed including scalp, head, eyes, ears, nose, lips, neck, chest, axillae, abdomen, back, buttocks, bilateral upper extremities, bilateral lower extremities, hands, feet, fingers, toes, fingernails, and toenails. All findings within normal limits unless otherwise noted below.  Face Erythema   Neck x 12 (12) Erythematous keratotic or waxy stuck-on papule or plaque.   Left Hip (side) - Posterior 0.5 cm brown macule   Assessment & Plan   Lentigines - Scattered tan macules - Due to sun exposure - Benign-appering, observe - Recommend daily broad spectrum sunscreen SPF 30+ to sun-exposed areas, reapply every 2 hours as needed. - Call for any changes  Seborrheic Keratoses - Stuck-on, waxy, tan-brown papules and/or plaques  - Benign-appearing - Discussed benign etiology and prognosis. - Observe - Call for any changes  Melanocytic Nevi - Tan-brown and/or pink-flesh-colored symmetric macules and papules - Benign appearing on exam today - Observation - Call clinic for new or changing moles - Recommend daily use of broad spectrum spf 30+ sunscreen to sun-exposed areas.   Hemangiomas - Red papules - Discussed benign nature - Observe - Call for any changes  Actinic Damage - Chronic  condition, secondary to cumulative UV/sun exposure - diffuse scaly erythematous macules with underlying dyspigmentation - Recommend daily broad spectrum sunscreen SPF 30+ to sun-exposed areas, reapply every 2 hours as needed.  - Staying in the shade or wearing long sleeves, sun glasses (UVA+UVB protection) and wide brim hats (4-inch brim around the entire circumference of the hat) are also recommended for sun protection.  - Call for new or changing lesions.  Skin cancer screening performed today.  Keratosis Pilaris - Tiny follicular keratotic papules - Benign. Genetic in nature. No cure. - Observe. - If desired, patient can use an emollient (moisturizer) containing ammonium lactate, urea or salicylic acid once a day to smooth the area  Rosacea Face  Will prescribe [facility] metronidazole/ivermectin/azelaic acid twice daily as needed to affected areas on the face. The patient was advised this is not covered by insurance since it is made by a compounding pharmacy. They will receive an email to check out and the medication will be mailed to their home.    Inflamed seborrheic keratosis Neck x 12  Destruction of lesion - Neck x 12 Complexity: simple   Destruction method: cryotherapy   Informed consent: discussed and consent obtained   Timeout:  patient name, date of birth, surgical site, and procedure verified Lesion destroyed using liquid nitrogen: Yes   Region frozen until ice ball extended beyond lesion: Yes   Outcome: patient tolerated procedure well with no complications   Post-procedure details: wound care instructions given    Nevus Left Hip (side) - Posterior  Benign appearing. Observe.  Return in about 1 year (around 07/20/2022)  for TBSE.  I, Ashok Cordia, CMA, am acting as scribe for [name], MD . Documentation: I have reviewed the above documentation for accuracy and completeness, and I agree with the above.  [name], MD

## 2021-07-21 ENCOUNTER — Ambulatory Visit (INDEPENDENT_AMBULATORY_CARE_PROVIDER_SITE_OTHER): Payer: 59 | Admitting: Podiatry

## 2021-07-21 ENCOUNTER — Encounter: Payer: Self-pay | Admitting: Dermatology

## 2021-07-21 ENCOUNTER — Encounter: Payer: Self-pay | Admitting: Podiatry

## 2021-07-21 DIAGNOSIS — M76822 Posterior tibial tendinitis, left leg: Secondary | ICD-10-CM

## 2021-07-21 MED ORDER — BETAMETHASONE SOD PHOS & ACET 6 (3-3) MG/ML IJ SUSP
3.0000 mg | Freq: Once | INTRAMUSCULAR | Status: AC
  Administered 2021-07-21: 3 mg via INTRA_ARTICULAR

## 2021-07-21 MED ORDER — METHYLPREDNISOLONE 4 MG PO TBPK: ORAL_TABLET | ORAL | 0 refills | Status: DC

## 2021-07-21 MED ORDER — MELOXICAM 15 MG PO TABS: 15.0000 mg | ORAL_TABLET | Freq: Every day | ORAL | 1 refills | Status: DC

## 2021-07-21 NOTE — Progress Notes (Signed)
    HPI: 35 y.o. female presenting today for new complaint regarding pain and tenderness to the left ankle that is been going on for approximately 10 days now.  She denies a history of injury.  She states that the pain is very severe.  She actually went to [facility] on 07/21/2021 and she was placed in a cam boot and instructed to take OTC ibuprofen.  She presents today for follow-up treatment and evaluation  Past Medical History:  Diagnosis Date   Allergy    Asthma    Interstitial cystitis    Seizures ([place])      Physical Exam: General: The patient is alert and oriented x3 in no acute distress.  Dermatology: Skin is warm, dry and supple bilateral lower extremities. Negative for open lesions or macerations.  Vascular: Palpable pedal pulses bilaterally. No edema or erythema noted. Capillary refill within normal limits.  Neurological: Epicritic and protective threshold grossly intact bilaterally.   Musculoskeletal Exam: Pain on palpation noted along posterior tibial tendon of the left lower extremity. Range of motion within normal limits. Muscle strength 5/5 in all muscle groups bilateral lower extremities.  Radiographic Exam taken prior at [facility]  Please see their radiology report  Assessment: 1. Posterior tibial tendinitis left   Plan of Care:  1. Patient was evaluated. Radiographs were reviewed today. 2. Injection of 0.5 mL Celestone Soluspan injected into the posterior tibial tendon sheath.  3.  Prescription for Medrol Dosepak 4.  Prescription for meloxicam 15 mg daily after completion of the Dosepak 5.  Continue weightbearing in the cam boot x4 weeks 6.  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 Edrick Kins, DPM    [address], [place] [address]                Office [phone]  Fax [phone]

## 2021-07-27 ENCOUNTER — Ambulatory Visit: Payer: 59 | Admitting: Obstetrics and Gynecology

## 2021-07-29 ENCOUNTER — Ambulatory Visit
Admission: RE | Admit: 2021-07-29 | Discharge: 2021-07-29 | Disposition: A | Discharge status: Home / Self Care | Payer: 59 | Source: Ambulatory Visit | Attending: Emergency Medicine | Admitting: Emergency Medicine

## 2021-07-29 ENCOUNTER — Other Ambulatory Visit: Payer: Self-pay

## 2021-07-29 VITALS — BP 123/83 | HR 102 | Temp 98.0°F | Resp 18

## 2021-07-29 DIAGNOSIS — B349 Viral infection, unspecified: Secondary | ICD-10-CM

## 2021-07-29 NOTE — ED Provider Notes (Signed)
[name]    CSN: [id number] Arrival date & time: 07/29/21  1423      History   Chief Complaint Chief Complaint  Patient presents with   Diarrhea   Headache   Nasal Congestion    HPI Pamela Dillon is a 35 y.o. female.  Patient presents with 3-day history of headache, runny nose, congestion, sore throat, diarrhea.  She denies fever, chills, cough, wheezing, shortness of breath, or other symptoms.  She has been treating her symptoms with Tylenol, Mucinex, albuterol inhaler.  She also completed a course of prednisone 3 days ago which she was taking for a foot injury.  Her medical history includes asthma, seasonal allergies, seizures.  The history is provided by the patient and medical records.   Past Medical History:  Diagnosis Date   Allergy    Asthma    Interstitial cystitis    Seizures ([place])     Patient Active Problem List   Diagnosis Date Noted   Egg allergy 12/13/2019   GBS bacteriuria 07/24/2019   Focal nodular hyperplasia of liver 11/27/2018   Impingement syndrome of shoulder region 05/26/2018   Chronic seasonal allergic rhinitis due to fungal spores 10/11/2016   Extrinsic asthma 05/15/2015   Dyspnea 05/15/2015   Interstitial cystitis (chronic) without hematuria 05/13/2009    Past Surgical History:  Procedure Laterality Date   LAPAROSCOPY  2010   CYSTICS.   URETEROSCOPY  2010   WISDOM TOOTH EXTRACTION     X 3    OB History     Gravida  1   Para  0   Term  0   Preterm  0   AB  0   Living  0      SAB  0   IAB  0   Ectopic  0   Multiple  0   Live Births               Home Medications    Prior to Admission medications   Medication Sig Start Date End Date Taking? Authorizing Provider  Acetaminophen 500 MG capsule Take by mouth.    [provider]  albuterol (PROAIR HFA) 108 (90 BASE) MCG/ACT inhaler Inhale 2 puffs into the lungs every 4 (four) hours as needed for wheezing or shortness of breath.  10/03/15   Juanito Doom, MD  budesonide-formoterol (SYMBICORT) 80-4.5 MCG/ACT inhaler Inhale 80 mcg into the lungs 2 (two) times daily. 03/17/15   [provider]  cyclobenzaprine (FLEXERIL) 10 MG tablet cyclobenzaprine 10 mg tablet    [provider]  fluticasone (FLONASE) 50 MCG/ACT nasal spray fluticasone propionate 50 mcg/actuation nasal spray,suspension 01/02/18   [provider]  LORazepam (ATIVAN) 0.5 MG tablet Take by mouth. 11/25/20   [provider]  meloxicam (MOBIC) 15 MG tablet Take 1 tablet (15 mg total) by mouth daily. 07/21/21   Edrick Kins, DPM  methocarbamol (ROBAXIN) 500 MG tablet Take 1 tablet by mouth 3 (three) times daily. 11/28/20   [provider]  methylPREDNISolone (MEDROL DOSEPAK) 4 MG TBPK tablet 6 day dose pack - take as directed 07/21/21   Edrick Kins, DPM  montelukast (SINGULAIR) 10 MG tablet TAKE 1 TABLET BY MOUTH EVERY DAY EVERY NIGHT 10/30/18   [provider]  Spacer/Aero-Holding Chambers (AEROCHAMBER MV) inhaler Use as instructed 05/29/15   Juanito Doom, MD    Family History Family History  Problem Relation Age of Onset   Multiple sclerosis Mother  Cancer Maternal Grandmother        BREAST.   Cancer Maternal Grandfather        THYRIOD.   Cancer Paternal Grandmother        LUNG   Stroke Paternal Grandfather    Heart disease Paternal Grandfather        HEART ATTACK.    Social History Social History   Tobacco Use   Smoking status: Never   Smokeless tobacco: Never  Vaping Use   Vaping Use: Never used  Substance Use Topics   Alcohol use: Yes    Alcohol/week: 0.0 standard drinks    Comment: occassionally   Drug use: No     Allergies   Demerol [meperidine], Meperidine hcl, Diphenhydramine, and Sulfa antibiotics   Review of Systems Review of Systems  Constitutional:  Negative for chills and fever.  HENT:  Positive for congestion, rhinorrhea and sore throat. Negative for ear  pain.   Respiratory:  Negative for cough and shortness of breath.   Cardiovascular:  Negative for chest pain and palpitations.  Gastrointestinal:  Positive for diarrhea. Negative for abdominal pain and vomiting.  Skin:  Negative for color change and rash.  Neurological:  Positive for headaches. Negative for dizziness and syncope.  All other systems reviewed and are negative.   Physical Exam Triage Vital Signs ED Triage Vitals  Enc Vitals Group     BP      Pulse      Resp      Temp      Temp src      SpO2      Weight      Height      Head Circumference      Peak Flow      Pain Score      Pain Loc      Pain Edu?      Excl. in [place]?    No data found.  Updated Vital Signs BP 123/83 (BP Location: Left Arm)   Pulse (!) 102   Temp 98 F (36.7 C) (Oral)   Resp 18   SpO2 98%   Visual Acuity Right Eye Distance:   Left Eye Distance:   Bilateral Distance:    Right Eye Near:   Left Eye Near:    Bilateral Near:     Physical Exam Vitals and nursing note reviewed.  Constitutional:      General: She is not in acute distress.    Appearance: She is well-developed. She is not ill-appearing.  HENT:     Head: Normocephalic and atraumatic.     Right Ear: Tympanic membrane normal.     Left Ear: Tympanic membrane normal.     Nose: Nose normal.     Mouth/Throat:     Mouth: Mucous membranes are moist.     Pharynx: Oropharynx is clear.  Eyes:     Conjunctiva/sclera: Conjunctivae normal.  Cardiovascular:     Rate and Rhythm: Normal rate and regular rhythm.     Heart sounds: Normal heart sounds.  Pulmonary:     Effort: Pulmonary effort is normal. No respiratory distress.     Breath sounds: Normal breath sounds. No wheezing, rhonchi or rales.  Abdominal:     Palpations: Abdomen is soft.     Tenderness: There is no abdominal tenderness.  Musculoskeletal:     Cervical back: Neck supple.  Skin:    General: Skin is warm and dry.  Neurological:     General: No focal deficit  present.     Mental Status: She is alert and oriented to person, place, and time.     Gait: Gait normal.  Psychiatric:        Mood and Affect: Mood normal.        Behavior: Behavior normal.     UC Treatments / Results  Labs (all labs ordered are listed, but only abnormal results are displayed) Labs Reviewed  NOVEL CORONAVIRUS, NAA    EKG   Radiology No results found.  Procedures Procedures (including critical care time)  Medications Ordered in UC Medications - No data to display  Initial Impression / Assessment and Plan / UC Course  I have reviewed the triage vital signs and the nursing notes.  Pertinent labs & imaging results that were available during my care of the patient were reviewed by me and considered in my medical decision making (see chart for details).   Viral illness.  No respiratory distress; O2 sat 98% on room air; no wheezes.  COVID pending.  Instructed patient to self quarantine per CDC guidelines.  Discussed symptomatic treatment including Tylenol or ibuprofen, rest, hydration.  Instructed patient to follow up with PCP if symptoms are not improving.  Patient agrees to plan of care.    Final Clinical Impressions(s) / UC Diagnoses   Final diagnoses:  Viral illness     Discharge Instructions      Your COVID test is pending.  You should self quarantine until the test result is back.    Take Tylenol or ibuprofen as needed for fever or discomfort.  Rest and keep yourself hydrated.    Follow-up with your primary care provider if your symptoms are not improving.         ED Prescriptions   None    PDMP not reviewed this encounter.   Sharion Balloon, NP 07/29/21 [phone]

## 2021-07-29 NOTE — Discharge Instructions (Addendum)
Your COVID test is pending.  You should self quarantine until the test result is back.    Take Tylenol or ibuprofen as needed for fever or discomfort.  Rest and keep yourself hydrated.    Follow-up with your primary care provider if your symptoms are not improving.     

## 2021-07-29 NOTE — ED Triage Notes (Signed)
Patient presents to Urgent Care with complaints of runny nose, headache, diarrhea x 3 days. Treating symptoms with her daily inhalers, mucinex, prednisone, and tylenol.   Denies fever.

## 2021-07-30 LAB — NOVEL CORONAVIRUS, NAA: SARS-CoV-2, NAA: NOT DETECTED

## 2021-07-30 LAB — SARS-COV-2, NAA 2 DAY TAT

## 2021-08-17 ENCOUNTER — Other Ambulatory Visit: Payer: Self-pay

## 2021-08-17 ENCOUNTER — Encounter: Payer: Self-pay | Admitting: Obstetrics and Gynecology

## 2021-08-17 ENCOUNTER — Ambulatory Visit (INDEPENDENT_AMBULATORY_CARE_PROVIDER_SITE_OTHER): Payer: 59 | Admitting: Obstetrics and Gynecology

## 2021-08-17 VITALS — BP 122/70 | HR 80 | Ht 61.0 in | Wt 210.0 lb

## 2021-08-17 DIAGNOSIS — Z1239 Encounter for other screening for malignant neoplasm of breast: Secondary | ICD-10-CM

## 2021-08-17 DIAGNOSIS — Z01419 Encounter for gynecological examination (general) (routine) without abnormal findings: Secondary | ICD-10-CM

## 2021-08-17 NOTE — Progress Notes (Signed)
Gynecology Annual Exam   PCP: Barbaraann Boys, MD  Chief Complaint:  Chief Complaint  Patient presents with   Gynecologic Exam    Annual - no concerns. RM 5    History of Present Illness: Patient is a 35 y.o. G1P0000 presents for annual exam. The patient has no complaints today.   LMP: Patient's last menstrual period was 08/12/2021. Average Interval: regular, 28 days Duration of flow: 5 days Heavy Menses: no Clots: no Intermenstrual Bleeding: no Postcoital Bleeding: no Dysmenorrhea: no + Moliminal symptoms  The patient is sexually active. She currently uses none for contraception. She denies dyspareunia.  The patient does perform self breast exams.  There is no notable family history of breast or ovarian cancer in her family.  The patient wears seatbelts: yes.   The patient has regular exercise: not asked.    The patient denies current symptoms of depression.    Review of Systems: Review of Systems  Constitutional:  Negative for chills and fever.  HENT:  Negative for congestion.   Respiratory:  Negative for cough and shortness of breath.   Cardiovascular:  Negative for chest pain and palpitations.  Gastrointestinal:  Negative for abdominal pain, constipation, diarrhea, heartburn, nausea and vomiting.  Genitourinary:  Negative for dysuria, frequency and urgency.  Skin:  Negative for itching and rash.  Neurological:  Negative for dizziness and headaches.  Endo/Heme/Allergies:  Negative for polydipsia.  Psychiatric/Behavioral:  Negative for depression.    Past Medical History:  Patient Active Problem List   Diagnosis Date Noted   Egg allergy 12/13/2019   GBS bacteriuria 07/24/2019   Focal nodular hyperplasia of liver 11/27/2018    Formatting of this note might be different from the original. Seen on MRI, repeat in 6 months recommended    Impingement syndrome of shoulder region 05/26/2018   Chronic seasonal allergic rhinitis due to fungal spores 10/11/2016    Extrinsic asthma 05/15/2015    05/20/2015 6 minute walk: Walked 1500 feet on room air O2 saturation 96-98% 05/20/2015 full pulmonary function testing ratio 79%, FEV1 3.03 L (110% predicted), total lung capacity 4.63 L (102% predicted), DLCO 19.6 (80% predicted) 05/20/2015 methacholine challenge positive at level II    Dyspnea 05/15/2015   Interstitial cystitis (chronic) without hematuria 05/13/2009    Formatting of this note might be different from the original. Sees Dr. Star Age at [facility]     Past Surgical History:  Past Surgical History:  Procedure Laterality Date   LAPAROSCOPY  2010   CYSTICS.   URETEROSCOPY  2010   WISDOM TOOTH EXTRACTION     X 3    Gynecologic History:  Patient's last menstrual period was 08/12/2021. Contraception: none Last Pap: Results were: 7/30/2021NIL and HR HPV negative   Obstetric History: G1P0000  Family History:  Family History  Problem Relation Age of Onset   Multiple sclerosis Mother    Cancer Maternal Grandmother        BREAST.   Cancer Maternal Grandfather        THYRIOD.   Cancer Paternal Grandmother        LUNG   Stroke Paternal Grandfather    Heart disease Paternal Grandfather        HEART ATTACK.    Social History:  Social History   Socioeconomic History   Marital status: Single    Spouse name: Not on file   Number of children: Not on file   Years of education: Not on file   Highest education level:  Not on file  Occupational History   Not on file  Tobacco Use   Smoking status: Never   Smokeless tobacco: Never  Vaping Use   Vaping Use: Never used  Substance and Sexual Activity   Alcohol use: Yes    Alcohol/week: 0.0 standard drinks    Comment: occassionally   Drug use: No   Sexual activity: Yes    Partners: Male    Birth control/protection: None  Other Topics Concern   Not on file  Social History Narrative   Not on file   Social Determinants of Health   Financial Resource Strain: Not on file   Food Insecurity: Not on file  Transportation Needs: Not on file  Physical Activity: Not on file  Stress: Not on file  Social Connections: Not on file  Intimate Partner Violence: Not on file    Allergies:  Allergies  Allergen Reactions   Demerol [Meperidine]     seizure   Meperidine Hcl Other (See Comments)   Diphenhydramine    Sulfa Antibiotics Hives    Medications: Prior to Admission medications   Medication Sig Start Date End Date Taking? Authorizing Provider  budesonide-formoterol (SYMBICORT) 80-4.5 MCG/ACT inhaler Inhale 80 mcg into the lungs 2 (two) times daily. 03/17/15  Yes [provider]  fluticasone (FLONASE) 50 MCG/ACT nasal spray fluticasone propionate 50 mcg/actuation nasal spray,suspension 01/02/18  Yes [provider]  meloxicam (MOBIC) 15 MG tablet Take 1 tablet (15 mg total) by mouth daily. 07/21/21  Yes Edrick Kins, DPM  montelukast (SINGULAIR) 10 MG tablet TAKE 1 TABLET BY MOUTH EVERY DAY EVERY NIGHT 10/30/18  Yes [provider]  Acetaminophen 500 MG capsule Take by mouth.    [provider]  albuterol (PROAIR HFA) 108 (90 BASE) MCG/ACT inhaler Inhale 2 puffs into the lungs every 4 (four) hours as needed for wheezing or shortness of breath. 10/03/15   Juanito Doom, MD  LORazepam (ATIVAN) 0.5 MG tablet Take by mouth. 11/25/20   [provider]  methocarbamol (ROBAXIN) 500 MG tablet Take 1 tablet by mouth 3 (three) times daily. 11/28/20   [provider]  methylPREDNISolone (MEDROL DOSEPAK) 4 MG TBPK tablet 6 day dose pack - take as directed Patient not taking: Reported on 08/17/2021 07/21/21   Edrick Kins, DPM  Spacer/Aero-Holding Chambers (AEROCHAMBER MV) inhaler Use as instructed 05/29/15   Juanito Doom, MD    Physical Exam Vitals: Blood pressure 122/70, pulse 80, height '5\' 1"'$  (1.549 m), weight 210 lb (95.3 kg), last menstrual period 08/12/2021.  General: NAD HEENT: normocephalic,  anicteric Thyroid: no enlargement, no palpable nodules Pulmonary: No increased work of breathing, CTAB Cardiovascular: RRR, distal pulses 2+ Breast: Breast symmetrical, no tenderness, no palpable nodules or masses, no skin or nipple retraction present, no nipple discharge.  No axillary or supraclavicular lymphadenopathy. Abdomen: NABS, soft, non-tender, non-distended.  Umbilicus without lesions.  No hepatomegaly, splenomegaly or masses palpable. No evidence of hernia  Genitourinary:  External: Normal external female genitalia.  Normal urethral meatus, normal Bartholin's and Skene's glands.    Vagina: Normal vaginal mucosa, no evidence of prolapse.    Cervix: Grossly normal in appearance, no bleeding  Uterus: Non-enlarged, mobile, normal contour.  No CMT  Adnexa: ovaries non-enlarged, no adnexal masses  Rectal: deferred  Lymphatic: no evidence of inguinal lymphadenopathy Extremities: no edema, erythema, or tenderness Neurologic: Grossly intact Psychiatric: mood appropriate, affect full  Female chaperone present for pelvic and breast  portions of the physical exam  Assessment: 35 y.o. G1P0000 routine annual exam  Plan: Problem List Items Addressed This Visit   None   1) STI screening  was notoffered and therefore not obtained  2)  ASCCP guidelines and rational discussed.  Patient opts for every 3 years screening interval  3) Contraception - the patient is currently using  none.  She is attempting to conceive in the near future  4) Routine healthcare maintenance including cholesterol, diabetes screening discussed managed by PCP  5) No follow-ups on file.   Malachy Mood, MD, [facility], [facility] 08/17/2021, 2:33 PM

## 2021-08-18 ENCOUNTER — Ambulatory Visit (INDEPENDENT_AMBULATORY_CARE_PROVIDER_SITE_OTHER): Payer: 59 | Admitting: Podiatry

## 2021-08-18 DIAGNOSIS — M76822 Posterior tibial tendinitis, left leg: Secondary | ICD-10-CM

## 2021-08-18 DIAGNOSIS — M66872 Spontaneous rupture of other tendons, left ankle and foot: Secondary | ICD-10-CM | POA: Diagnosis not present

## 2021-08-18 NOTE — Progress Notes (Signed)
    HPI: 35 y.o. female presenting today for new complaint regarding pain and tenderness to the left ankle that is been going on for 1 month now.  She denies a history of injury.  She states that the pain is very severe.  She actually went to [facility] on 07/21/2021 and she was placed in a cam boot and instructed to take OTC ibuprofen.    Patient has been in the cam boot for the past month and taken anti-inflammatories.  There is only minimal improvement.  She continues to have pain and tenderness although the edema and swelling has gone down significantly.  She presents today for follow-up treatment and evaluation  Past Medical History:  Diagnosis Date   Allergy    Asthma    Interstitial cystitis    Seizures ([place])      Physical Exam: General: The patient is alert and oriented x3 in no acute distress.  Dermatology: Skin is warm, dry and supple bilateral lower extremities. Negative for open lesions or macerations.  Vascular: Palpable pedal pulses bilaterally. No edema or erythema noted. Capillary refill within normal limits.  Neurological: Epicritic and protective threshold grossly intact bilaterally.   Musculoskeletal Exam: There continues to be pain on palpation noted along posterior tibial tendon of the left lower extremity. Range of motion within normal limits. Muscle strength 5/5 in all muscle groups bilateral lower extremities.  Radiographic Exam taken prior at [facility].  Please see their radiology report  Assessment: 1. Posterior tibial tendinitis left   Plan of Care:  1. Patient was evaluated. Radiographs were reviewed today. 2.  Although the inflammation and edema has improved, the patient continues to have significant pain and tenderness along the posterior tibial tendon despite wearing the cam boot 3.  Order placed for MRI left ankle without contrast.  Medically necessary.  Conservative treatments have not been successful in providing any significant alleviation of  symptoms for the patient. 4.  Ankle brace dispensed to transition between the cam boot and ankle brace 5.  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 Edrick Kins, DPM    [address], [place] [address]                Office [phone]  Fax [phone]

## 2021-09-03 ENCOUNTER — Ambulatory Visit
Admission: RE | Admit: 2021-09-03 | Discharge: 2021-09-03 | Disposition: A | Discharge status: Home / Self Care | Payer: 59 | Source: Ambulatory Visit | Attending: Podiatry | Admitting: Podiatry

## 2021-09-03 DIAGNOSIS — M66872 Spontaneous rupture of other tendons, left ankle and foot: Secondary | ICD-10-CM

## 2021-09-03 DIAGNOSIS — M76822 Posterior tibial tendinitis, left leg: Secondary | ICD-10-CM

## 2021-09-08 ENCOUNTER — Encounter: Payer: Self-pay | Admitting: Podiatry

## 2021-09-08 ENCOUNTER — Ambulatory Visit (INDEPENDENT_AMBULATORY_CARE_PROVIDER_SITE_OTHER): Payer: 59 | Admitting: Podiatry

## 2021-09-08 ENCOUNTER — Other Ambulatory Visit: Payer: Self-pay

## 2021-09-08 DIAGNOSIS — M76822 Posterior tibial tendinitis, left leg: Secondary | ICD-10-CM | POA: Diagnosis not present

## 2021-09-08 MED ORDER — BETAMETHASONE SOD PHOS & ACET 6 (3-3) MG/ML IJ SUSP
3.0000 mg | Freq: Once | INTRAMUSCULAR | Status: AC
  Administered 2021-09-08: 3 mg via INTRA_ARTICULAR

## 2021-09-08 NOTE — Progress Notes (Signed)
    HPI: 35 y.o. female presenting today for follow-up evaluation regarding posterior tibial tendinitis to the left lower extremity.  Last visit on 08/18/2021 MRI was ordered.  Patient states that since then there has been some slight improvement.  She continues to have some pain and tenderness to the area however.  She presents today to review the MRI results and discuss further treatment options  Past Medical History:  Diagnosis Date   Allergy    Asthma    Interstitial cystitis    Seizures ([place])      Physical Exam: General: The patient is alert and oriented x3 in no acute distress.  Dermatology: Skin is warm, dry and supple bilateral lower extremities. Negative for open lesions or macerations.  Vascular: Palpable pedal pulses bilaterally. No edema or erythema noted. Capillary refill within normal limits.  Neurological: Epicritic and protective threshold grossly intact bilaterally.   Musculoskeletal Exam: There continues to be pain on palpation noted along posterior tibial tendon of the left lower extremity. Range of motion within normal limits. Muscle strength 5/5 in all muscle groups bilateral lower extremities.  MR ankle LT wo contrast 09/03/2021: IMPRESSION: 1. Intact medial and lateral ankle ligaments and tendons. 2. Mild tenosynovitis involving the posterior tibialis tendon but no tendinopathy or tear. 3. No acute bony findings or significant degenerative changes.  Assessment: 1. Posterior tibial tendinitis left   Plan of Care:  1. Patient was evaluated.  MRI reviewed today 2.  Injection of 0.5 cc Celestone Soluspan injected along posterior tibial tendon sheath left 3.  Continue weightbearing in the cam boot x3-4 additional weeks 4.  Prescription for physical therapy at [facility] to help transition the patient out of the cam boot into good supportive sneakers 5.  Continue meloxicam 6.  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   Edrick Kins,  DPM [facility]  Dr. Edrick Kins, DPM    [address], [place] [address]                Office [phone]  Fax [phone]

## 2021-09-15 ENCOUNTER — Other Ambulatory Visit: Payer: Self-pay | Admitting: Podiatry

## 2021-10-06 ENCOUNTER — Ambulatory Visit (INDEPENDENT_AMBULATORY_CARE_PROVIDER_SITE_OTHER): Payer: 59 | Admitting: Podiatry

## 2021-10-06 ENCOUNTER — Other Ambulatory Visit: Payer: Self-pay

## 2021-10-06 DIAGNOSIS — M76822 Posterior tibial tendinitis, left leg: Secondary | ICD-10-CM | POA: Diagnosis not present

## 2021-10-06 NOTE — Progress Notes (Signed)
    HPI: 35 y.o. female presenting today for follow-up evaluation regarding posterior tibial tendinitis to the left lower extremity.  Patient states that she is feeling much better.  There has been significant improvement.  She is going to physical therapy with improvement.  She is also taking meloxicam daily.  She presents for further treatment and evaluation  Past Medical History:  Diagnosis Date   Allergy    Asthma    Interstitial cystitis    Seizures ([place])      Physical Exam: General: The patient is alert and oriented x3 in no acute distress.  Dermatology: Skin is warm, dry and supple bilateral lower extremities. Negative for open lesions or macerations.  Vascular: Palpable pedal pulses bilaterally. No edema or erythema noted. Capillary refill within normal limits.  Neurological: Epicritic and protective threshold grossly intact bilaterally.   Musculoskeletal Exam: There continues to be pain on palpation noted along posterior tibial tendon of the left lower extremity. Range of motion within normal limits. Muscle strength 5/5 in all muscle groups bilateral lower extremities.  MR ankle LT wo contrast 09/03/2021: IMPRESSION: 1. Intact medial and lateral ankle ligaments and tendons. 2. Mild tenosynovitis involving the posterior tibialis tendon but no tendinopathy or tear. 3. No acute bony findings or significant degenerative changes.  Assessment: 1. Posterior tibial tendinitis left   Plan of Care:  1. Patient was evaluated.  MRI reviewed today 2.  Injection of 0.5 cc Celestone Soluspan injected along posterior tibial tendon sheath left 3.  Continue physical therapy at [facility] 4.  Continue meloxicam 15 mg daily 5.  Overall the patient is doing significantly better.  Slowly transition back to full activity no restrictions 6.  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   Edrick Kins, DPM [facility]  Dr. Edrick Kins, DPM    [address], [place] [address]                Office [phone]  Fax [phone]

## 2021-10-12 ENCOUNTER — Encounter: Payer: Self-pay | Admitting: Podiatry

## 2021-10-12 ENCOUNTER — Telehealth: Payer: Self-pay | Admitting: Podiatry

## 2021-10-12 NOTE — Telephone Encounter (Signed)
That's fine. - Dr. Amalia Hailey

## 2021-10-12 NOTE — Telephone Encounter (Signed)
Patient called and stated she needs a work note stating she can go back to work without restrictions.  Please advise

## 2021-10-16 ENCOUNTER — Other Ambulatory Visit: Payer: Self-pay

## 2021-10-16 ENCOUNTER — Ambulatory Visit (INDEPENDENT_AMBULATORY_CARE_PROVIDER_SITE_OTHER): Payer: 59 | Admitting: Podiatry

## 2021-10-16 DIAGNOSIS — L600 Ingrowing nail: Secondary | ICD-10-CM

## 2021-10-16 MED ORDER — GENTAMICIN SULFATE 0.1 % EX CREA: 1.0000 "application " | TOPICAL_CREAM | Freq: Two times a day (BID) | CUTANEOUS | 1 refills | Status: DC

## 2021-10-16 MED ORDER — DOXYCYCLINE HYCLATE 100 MG PO TABS: 100.0000 mg | ORAL_TABLET | Freq: Two times a day (BID) | ORAL | 0 refills | Status: DC

## 2021-10-16 NOTE — Progress Notes (Signed)
   Subjective: Patient presents today for evaluation of pain to the medial border left great toe. Patient is concerned for possible ingrown nail.  It is very sensitive to touch.  Patient presents today for further treatment and evaluation.  Past Medical History:  Diagnosis Date   Allergy    Asthma    Interstitial cystitis    Seizures ([place])     Objective:  General: Well developed, nourished, in no acute distress, alert and oriented x3   Dermatology: Skin is warm, dry and supple bilateral.  Medial border left great toe appears to be erythematous with evidence of an ingrowing nail. Pain on palpation noted to the border of the nail fold. The remaining nails appear unremarkable at this time. There are no open sores, lesions.  Vascular: Dorsalis Pedis artery and Posterior Tibial artery pedal pulses palpable. No lower extremity edema noted.   Neruologic: Grossly intact via light touch bilateral.  Musculoskeletal: Muscular strength within normal limits in all groups bilateral. Normal range of motion noted to all pedal and ankle joints.   Assesement: #1 Paronychia with ingrowing nail medial border left great toe #2 Pain in toe Number 3H/0 posterior tibial tendinitis LT; currently resolved  Plan of Care:  1. Patient evaluated.  2. Discussed treatment alternatives and plan of care. Explained nail avulsion procedure and post procedure course to patient. 3. Patient opted for permanent partial nail avulsion of the ingrown portion of the nail.  4. Prior to procedure, local anesthesia infiltration utilized using 3 ml of a 50:50 mixture of 2% plain lidocaine and 0.5% plain marcaine in a normal hallux block fashion and a betadine prep performed.  5. Partial permanent nail avulsion with chemical matrixectomy performed using [id number] applications of phenol followed by alcohol flush.  6. Light dressing applied.  Post care instructions provided 7.  Prescription for gentamicin 2% cream  8.  Prescription  for doxycycline 100 mg 2 times daily #20  9.  Return to clinic 2 weeks.  Edrick Kins, DPM [facility]  Dr. Edrick Kins, DPM    [address], [place] [address]                Office [phone]  Fax [phone]

## 2021-10-19 ENCOUNTER — Encounter: Payer: Self-pay | Admitting: Podiatry

## 2021-10-19 ENCOUNTER — Telehealth: Payer: Self-pay | Admitting: *Deleted

## 2021-10-19 ENCOUNTER — Other Ambulatory Visit: Payer: Self-pay | Admitting: Podiatry

## 2021-10-19 MED ORDER — FLUCONAZOLE 150 MG PO TABS: 150.0000 mg | ORAL_TABLET | Freq: Once | ORAL | 0 refills | Status: AC

## 2021-10-19 NOTE — Telephone Encounter (Signed)
"  I had an ingrown toenail removed on Friday morning with Dr. Amalia Hailey.  I was prescribed Doxycycline.  I have been vomiting all weekend and wanted to see if I can be prescribed another antibiotic.  I also want to see if I can get Diflucan prescribed.  I also need to go over the aftercare."

## 2021-10-19 NOTE — Telephone Encounter (Signed)
Spoke with the patient on the phone.  Diflucan sent.  I told her to discontinue all oral antibiotics.  Explained the soaking instructions.  Thanks, Dr. Amalia Hailey

## 2021-10-19 NOTE — Progress Notes (Signed)
PRN s/p partial nail avulsion

## 2021-10-30 ENCOUNTER — Ambulatory Visit (INDEPENDENT_AMBULATORY_CARE_PROVIDER_SITE_OTHER): Payer: 59 | Admitting: Podiatry

## 2021-10-30 ENCOUNTER — Other Ambulatory Visit: Payer: Self-pay

## 2021-10-30 DIAGNOSIS — L600 Ingrowing nail: Secondary | ICD-10-CM

## 2021-10-30 NOTE — Progress Notes (Signed)
   Subjective: 35 y.o. female presents today status post permanent nail avulsion procedure of the medial border left great toe that was performed on 10/16/2021.  Patient states she is doing well.  She has been soaking her foot and applying antibiotic cream as instructed.  She is concerned due to some redness around the area..   Past Medical History:  Diagnosis Date   Allergy    Asthma    Interstitial cystitis    Seizures ([place])     Objective: Skin is warm, dry and supple. Nail and respective nail fold appears to be healing appropriately. Open wound to the associated nail fold with a granular wound base and moderate amount of fibrotic tissue. Minimal drainage noted. Mild erythema around the periungual region likely due to phenol chemical matricectomy.  Assessment: #1 s/p partial permanent nail matrixectomy medial border left great toe #2 H/o PT tendinitis LT;  Currently resolved   Plan of care: #1 patient was evaluated  #2 light debridement of open wound was performed to the periungual border of the respective toe using a currette. Antibiotic ointment and Band-Aid was applied. #3 patient is to return to clinic on a PRN basis.   Edrick Kins, DPM [facility]  Dr. Edrick Kins, DPM    [address], [place] [address]                Office [phone]  Fax [phone]

## 2021-11-10 ENCOUNTER — Encounter: Payer: Self-pay | Admitting: Podiatry

## 2021-11-11 ENCOUNTER — Other Ambulatory Visit: Payer: Self-pay | Admitting: Podiatry

## 2021-11-11 NOTE — Telephone Encounter (Signed)
Called scheduled pt this Friday at 8:30 am.

## 2021-11-13 ENCOUNTER — Ambulatory Visit (INDEPENDENT_AMBULATORY_CARE_PROVIDER_SITE_OTHER): Payer: 59 | Admitting: Podiatry

## 2021-11-13 ENCOUNTER — Other Ambulatory Visit: Payer: Self-pay

## 2021-11-13 ENCOUNTER — Encounter: Payer: Self-pay | Admitting: Podiatry

## 2021-11-13 VITALS — Temp 97.9°F

## 2021-11-13 DIAGNOSIS — L600 Ingrowing nail: Secondary | ICD-10-CM

## 2021-11-13 MED ORDER — AMOXICILLIN 500 MG PO CAPS: 500.0000 mg | ORAL_CAPSULE | Freq: Two times a day (BID) | ORAL | 0 refills | Status: DC

## 2021-11-13 NOTE — Progress Notes (Signed)
   HPI: 35 y.o. female presenting today for follow-up evaluation of an ingrown toenail procedure that was performed to the left great toe medial border on 10/16/2021.  Patient states that she continues to have redness with drainage and irritation and pain to the nail avulsion site.  She has been applying antibiotic cream and a Band-Aid on it daily.  She was concerned this week when it was not improving and she presents for further treatment and evaluation  Past Medical History:  Diagnosis Date   Allergy    Asthma    Interstitial cystitis    Seizures ([place])      Physical Exam: General: The patient is alert and oriented x3 in no acute distress.  Dermatology: Skin is warm, dry and supple bilateral lower extremities.  Dry eschar noted to the nail avulsion matricectomy site.  At the moment there is no drainage.  Vascular: Palpable pedal pulses bilaterally.  There is some localized erythema around the distal portion of the left great toe.  Suspicious for either a cellulitis/abscess within the nail avulsion site or perhaps versus a contact dermatitis secondary to the adhesive bandages.  Neurological: Epicritic and protective threshold grossly intact bilaterally.   Musculoskeletal Exam: Tenderness to palpation left great toe with associated erythema.  Otherwise no pedal deformities noted  Assessment: 1. S/p partial nail matricectomy MED border LT hallux.  10/16/2021 2. Cellulitis vs. Contact dermatitis LT hallux   Plan of Care:  1. Patient evaluated.  Today I explained to the patient that I do believe it is either a cellulitis/possible abscess developing to the nail matricectomy site or also perhaps a contact dermatitis to the left hallux. 2.  The patient is very apprehensive about numbing the toe and cleaning up the nail avulsion site.  Today were going to pursue some conservative options prior to a more aggressive I&D of the nail avulsion site 3.  Continue soaking the foot warm water and Epsom  salt 2 times daily 4.  Continue gentamicin cream daily 5.  Discontinue any adhesive bandage around the toe.  Only recommend clean cotton sock 6.  Prescription for amoxicillin 500 mg 2 times daily #20.  The patient had severe nausea with the doxycycline and she was unable to take it. 7.  Return to clinic in 10 days, if there is no improvement we may need to numb the toe and proceed with an I&D of the nail avulsion site      Edrick Kins, DPM [facility]  Dr. Edrick Kins, DPM    [address], [place] [address]                Office [phone]  Fax [phone]

## 2021-11-24 ENCOUNTER — Ambulatory Visit (INDEPENDENT_AMBULATORY_CARE_PROVIDER_SITE_OTHER): Payer: 59 | Admitting: Podiatry

## 2021-11-24 ENCOUNTER — Other Ambulatory Visit: Payer: Self-pay

## 2021-11-24 DIAGNOSIS — L600 Ingrowing nail: Secondary | ICD-10-CM | POA: Diagnosis not present

## 2021-11-24 NOTE — Progress Notes (Signed)
   Subjective: 35 y.o. female presents today status post permanent nail avulsion procedure of the left great toe medial border that was performed on 10/16/2021. Patient has not been applying bandaids and the toe has improved significantly. She presents for follow up   Past Medical History:  Diagnosis Date   Allergy    Asthma    Interstitial cystitis    Seizures ([place])     Objective: Skin is warm, dry and supple. Nail and respective nail fold appears to be healing appropriately. Open wound to the associated nail fold with a granular wound base and moderate amount of fibrotic tissue. Minimal drainage noted. Mild erythema around the periungual region likely due to phenol chemical matricectomy.  Assessment: #1 s/p partial permanent nail matrixectomy medial border left great toe   Plan of care: #1 patient was evaluated.  Overall the significant improvement. #2 light debridement of open wound was performed to the periungual border of the respective toe using a currette. Antibiotic ointment and Band-Aid was applied. #3 patient is to return to clinic on a PRN basis.   Edrick Kins, DPM [facility]  Dr. Edrick Kins, DPM    [address], [place] [address]                Office [phone]  Fax [phone]

## 2021-12-22 ENCOUNTER — Encounter: Payer: Self-pay | Admitting: Obstetrics and Gynecology

## 2022-01-10 ENCOUNTER — Other Ambulatory Visit: Payer: Self-pay | Admitting: Podiatry

## 2022-03-21 ENCOUNTER — Other Ambulatory Visit: Payer: Self-pay | Admitting: Podiatry

## 2022-04-29 ENCOUNTER — Ambulatory Visit (INDEPENDENT_AMBULATORY_CARE_PROVIDER_SITE_OTHER): Payer: 59 | Admitting: Dermatology

## 2022-04-29 DIAGNOSIS — L304 Erythema intertrigo: Secondary | ICD-10-CM | POA: Diagnosis not present

## 2022-04-29 NOTE — Patient Instructions (Addendum)
Instructions for [facility] Medications ? ?One or more of your medications was sent to the [facility] mail order compounding pharmacy. You will receive an email from them and can purchase the medicine through that link. It will then be mailed to your home at the address you confirmed. If for any reason you do not receive an email from them, please check your spam folder. If you still do not find the email, please let us know. [facility] phone number is [phone].  ? ?Start Zeasorb AF powder, over the counter ? ? ? ?If You Need Anything After Your Visit ? ?If you have any questions or concerns for your doctor, please call our main line at [phone] and press option 4 to reach your doctor's medical assistant. If no one answers, please leave a voicemail as directed and we will return your call as soon as possible. Messages left after 4 pm will be answered the following business day.  ? ?You may also send [place] a message via MyChart. We typically respond to MyChart messages within 1-2 business days. ? ?For prescription refills, please ask your pharmacy to contact our office. Our fax number is [phone]. ? ?If you have an urgent issue when the clinic is closed that cannot wait until the next business day, you can page your doctor at the number below.   ? ?Please note that while we do our best to be available for urgent issues outside of office hours, we are not available 24/7.  ? ?If you have an urgent issue and are unable to reach [place], you may choose to seek medical care at your doctor's office, retail clinic, urgent care center, or emergency room. ? ?If you have a medical emergency, please immediately call 911 or go to the emergency department. ? ?Pager Numbers ? ?- Dr. Nehemiah Massed: [phone] ? ?- Dr. Laurence Ferrari: [phone] ? ?- Dr. Nicole Kindred: [phone] ? ?In the event of inclement weather, please call our main line at [phone] for an update on the status of any delays or closures. ? ?Dermatology  Medication Tips: ?Please keep the boxes that topical medications come in in order to help keep track of the instructions about where and how to use these. Pharmacies typically print the medication instructions only on the boxes and not directly on the medication tubes.  ? ?If your medication is too expensive, please contact our office at [phone] option 4 or send [place] a message through [place].  ? ?We are unable to tell what your co-pay for medications will be in advance as this is different depending on your insurance coverage. However, we may be able to find a substitute medication at lower cost or fill out paperwork to get insurance to cover a needed medication.  ? ?If a prior authorization is required to get your medication covered by your insurance company, please allow [place] 1-2 business days to complete this process. ? ?Drug prices often vary depending on where the prescription is filled and some pharmacies may offer cheaper prices. ? ?The website www.goodrx.com contains coupons for medications through different pharmacies. The prices here do not account for what the cost may be with help from insurance (it may be cheaper with your insurance), but the website can give you the price if you did not use any insurance.  ?- You can print the associated coupon and take it with your prescription to the pharmacy.  ?- You may also stop by our office during regular business hours and pick up a GoodRx  coupon card.  ?- If you need your prescription sent electronically to a different pharmacy, notify our office through [facility] or by phone at [phone] option 4. ? ? ? ? ?Si Usted Necesita Algo Despu?s de Su Visita ? ?Tambi?n puede enviarnos un mensaje a trav?s de MyChart. Por lo general respondemos a los mensajes de MyChart en el transcurso de 1 a 2 d?as h?biles. ? ?Para renovar recetas, por favor pida a su farmacia que se ponga en contacto con nuestra oficina. Nuestro n?mero de fax es el [phone]. ? ?Si  tiene un asunto urgente cuando la cl?nica est? cerrada y que no puede esperar hasta el siguiente d?a h?bil, puede llamar/localizar a su doctor(a) al n?mero que aparece a continuaci?n.  ? ?Por favor, tenga en cuenta que aunque hacemos todo lo posible para estar disponibles para asuntos urgentes fuera del horario de oficina, no estamos disponibles las 24 horas del d?a, los 7 d?as de la semana.  ? ?Si tiene un problema urgente y no puede comunicarse con nosotros, puede optar por buscar atenci?n m?dica  en el consultorio de su doctor(a), en una cl?nica privada, en un centro de atenci?n urgente o en una sala de emergencias. ? ?Si tiene Engineer, maintenance (IT) m?dica, por favor llame inmediatamente al 911 o vaya a la sala de emergencias. ? ?N?meros de b?per ? ?- Dr. Nehemiah Massed: [phone] ? ?- Dra. Moye: [phone] ? ?- Dra. Nicole Kindred: [phone] ? ?En caso de inclemencias del tiempo, por favor llame a nuestra l?nea principal al [phone] para una actualizaci?n sobre el estado de cualquier retraso o cierre. ? ?Consejos para la medicaci?n en dermatolog?a: ?Por favor, guarde las cajas en las que vienen los medicamentos de uso t?pico para ayudarle a seguir las instrucciones sobre d?nde y c?mo usarlos. Las farmacias generalmente imprimen las instrucciones del medicamento s?lo en las cajas y no directamente en los tubos del [place].  ? ?Si su medicamento es muy caro, por favor, p?ngase en contacto con Zigmund Daniel llamando al [phone] y presione la opci?n 4 o env?enos un mensaje a trav?s de MyChart.  ? ?No podemos decirle cu?l ser? su copago por los medicamentos por adelantado ya que esto es diferente dependiendo de la cobertura de su seguro. Sin embargo, es posible que podamos encontrar un medicamento sustituto a Electrical engineer un formulario para que el seguro cubra el medicamento que se considera necesario.  ? ?Si se requiere Ardelia Mems autorizaci?n previa para que su compa??a de seguros [place] su medicamento, por favor  perm?tanos de 1 a 2 d?as h?biles para completar este proceso. ? ?Los precios de los medicamentos var?an con frecuencia dependiendo del Environmental consultant de d?nde se surte la receta y alguna farmacias pueden ofrecer precios m?s baratos. ? ?El sitio web www.goodrx.com tiene cupones para medicamentos de Airline pilot. Los precios aqu? no tienen en cuenta lo que podr?a costar con la ayuda del seguro (puede ser m?s barato con su seguro), pero el sitio web puede darle el precio si no utiliz? ning?n seguro.  ?- Puede imprimir el cup?n correspondiente y llevarlo con su receta a la farmacia.  ?- Tambi?n puede pasar por nuestra oficina durante el horario de atenci?n regular y recoger una tarjeta de cupones de GoodRx.  ?- Si necesita que su receta se env?e electr?nicamente a Chiropodist, informe a nuestra oficina a trav?s de MyChart de [place] o por tel?fono llamando al [phone] y presione la opci?n 4.  ?

## 2022-04-29 NOTE — Progress Notes (Signed)
   Follow-Up Visit   Subjective  Pamela Dillon is a 36 y.o. female who presents for the following: Rash (Under breast, buttocks, ~1wk,  tried some expired alcortin didn't help).  The following portions of the chart were reviewed this encounter and updated as appropriate:   Tobacco  Allergies  Meds  Problems  Med Hx  Surg Hx  Fam Hx     Review of Systems:  No other skin or systemic complaints except as noted in HPI or Assessment and Plan.  Objective  Well appearing patient in no apparent distress; mood and affect are within normal limits.  A focused examination was performed including inframammary, buttocks. Relevant physical exam findings are noted in the Assessment and Plan.  inframammary, buttocks L medial inframammary erythema, maceration, perianal area appears clear today, no evidence of Lichen Sclerosus   Assessment & Plan  Intertrigo inframammary, buttocks  Intertrigo is a chronic recurrent rash that occurs in skin fold areas that may be associated with friction; heat; moisture; yeast; fungus; and bacteria.  It is exacerbated by increased movement / activity; sweating; and higher atmospheric temperature.  Start [facility] Iodoquinol 1%, Hydrocortisone 2.5%, Niacinamide 2% Cream once a day to affected areas for up to two weeks.  The patient was advised this is not covered by insurance since it is made by a compounding pharmacy. They will receive an email to check out and the medication will be mailed to their home.    Start Zeasorb AF powder qd   Return for as scheduled.  I, Othelia Pulling, RMA, am acting as scribe for [name], MD . Documentation: I have reviewed the above documentation for accuracy and completeness, and I agree with the above.  [name], MD

## 2022-05-14 ENCOUNTER — Encounter: Payer: Self-pay | Admitting: Dermatology

## 2022-05-25 ENCOUNTER — Telehealth: Payer: Self-pay

## 2022-05-25 NOTE — Telephone Encounter (Signed)
LMOVM returning her call, please call back.

## 2022-07-22 ENCOUNTER — Ambulatory Visit (INDEPENDENT_AMBULATORY_CARE_PROVIDER_SITE_OTHER): Payer: 59 | Admitting: Dermatology

## 2022-07-22 ENCOUNTER — Encounter: Payer: Self-pay | Admitting: Dermatology

## 2022-07-22 DIAGNOSIS — D229 Melanocytic nevi, unspecified: Secondary | ICD-10-CM

## 2022-07-22 DIAGNOSIS — L918 Other hypertrophic disorders of the skin: Secondary | ICD-10-CM

## 2022-07-22 DIAGNOSIS — Z1283 Encounter for screening for malignant neoplasm of skin: Secondary | ICD-10-CM

## 2022-07-22 DIAGNOSIS — L814 Other melanin hyperpigmentation: Secondary | ICD-10-CM

## 2022-07-22 DIAGNOSIS — L820 Inflamed seborrheic keratosis: Secondary | ICD-10-CM

## 2022-07-22 DIAGNOSIS — L858 Other specified epidermal thickening: Secondary | ICD-10-CM

## 2022-07-22 DIAGNOSIS — D1800 Hemangioma unspecified site: Secondary | ICD-10-CM

## 2022-07-22 DIAGNOSIS — L578 Other skin changes due to chronic exposure to nonionizing radiation: Secondary | ICD-10-CM

## 2022-07-22 NOTE — Patient Instructions (Addendum)
Cryotherapy Aftercare  Wash gently with soap and water everyday.   Apply Vaseline daily until healed.    Recommend daily broad spectrum sunscreen SPF 30+ to sun-exposed areas, reapply every 2 hours as needed. Call for new or changing lesions.  Staying in the shade or wearing long sleeves, sun glasses (UVA+UVB protection) and wide brim hats (4-inch brim around the entire circumference of the hat) are also recommended for sun protection.    Melanoma ABCDEs  Melanoma is the most dangerous type of skin cancer, and is the leading cause of death from skin disease.  You are more likely to develop melanoma if you: Have light-colored skin, light-colored eyes, or red or blond hair Spend a lot of time in the sun Tan regularly, either outdoors or in a tanning bed Have had blistering sunburns, especially during childhood Have a close family member who has had a melanoma Have atypical moles or large birthmarks  Early detection of melanoma is key since treatment is typically straightforward and cure rates are extremely high if we catch it early.   The first sign of melanoma is often a change in a mole or a new dark spot.  The ABCDE system is a way of remembering the signs of melanoma.  A for asymmetry:  The two halves do not match. B for border:  The edges of the growth are irregular. C for color:  A mixture of colors are present instead of an even brown color. D for diameter:  Melanomas are usually (but not always) greater than 6mm - the size of a pencil eraser. E for evolution:  The spot keeps changing in size, shape, and color.  Please check your skin once per month between visits. You can use a small mirror in front and a large mirror behind you to keep an eye on the back side or your body.   If you see any new or changing lesions before your next follow-up, please call to schedule a visit.  Please continue daily skin protection including broad spectrum sunscreen SPF 30+ to sun-exposed areas,  reapplying every 2 hours as needed when you're outdoors.   Staying in the shade or wearing long sleeves, sun glasses (UVA+UVB protection) and wide brim hats (4-inch brim around the entire circumference of the hat) are also recommended for sun protection.       Due to recent changes in healthcare laws, you may see results of your pathology and/or laboratory studies on MyChart before the doctors have had a chance to review them. We understand that in some cases there may be results that are confusing or concerning to you. Please understand that not all results are received at the same time and often the doctors may need to interpret multiple results in order to provide you with the best plan of care or course of treatment. Therefore, we ask that you please give us 2 business days to thoroughly review all your results before contacting the office for clarification. Should we see a critical lab result, you will be contacted sooner.   If You Need Anything After Your Visit  If you have any questions or concerns for your doctor, please call our main line at [phone] and press option 4 to reach your doctor's medical assistant. If no one answers, please leave a voicemail as directed and we will return your call as soon as possible. Messages left after 4 pm will be answered the following business day.   You may also send us a message   via MyChart. We typically respond to MyChart messages within 1-2 business days.  For prescription refills, please ask your pharmacy to contact our office. Our fax number is [phone].  If you have an urgent issue when the clinic is closed that cannot wait until the next business day, you can page your doctor at the number below.    Please note that while we do our best to be available for urgent issues outside of office hours, we are not available 24/7.   If you have an urgent issue and are unable to reach us, you may choose to seek medical care at your doctor's  office, retail clinic, urgent care center, or emergency room.  If you have a medical emergency, please immediately call 911 or go to the emergency department.  Pager Numbers  - Dr. Kowalski: [phone]  - Dr. Moye: [phone]  - Dr. Stewart: [phone]  In the event of inclement weather, please call our main line at [phone] for an update on the status of any delays or closures.  Dermatology Medication Tips: Please keep the boxes that topical medications come in in order to help keep track of the instructions about where and how to use these. Pharmacies typically print the medication instructions only on the boxes and not directly on the medication tubes.   If your medication is too expensive, please contact our office at [phone] option 4 or send us a message through MyChart.   We are unable to tell what your co-pay for medications will be in advance as this is different depending on your insurance coverage. However, we may be able to find a substitute medication at lower cost or fill out paperwork to get insurance to cover a needed medication.   If a prior authorization is required to get your medication covered by your insurance company, please allow us 1-2 business days to complete this process.  Drug prices often vary depending on where the prescription is filled and some pharmacies may offer cheaper prices.  The website www.goodrx.com contains coupons for medications through different pharmacies. The prices here do not account for what the cost may be with help from insurance (it may be cheaper with your insurance), but the website can give you the price if you did not use any insurance.  - You can print the associated coupon and take it with your prescription to the pharmacy.  - You may also stop by our office during regular business hours and pick up a GoodRx coupon card.  - If you need your prescription sent electronically to a different pharmacy, notify our office  through [facility] MyChart or by phone at [phone] option 4.     Si Usted Necesita Algo Despus de Su Visita  Tambin puede enviarnos un mensaje a travs de MyChart. Por lo general respondemos a los mensajes de MyChart en el transcurso de 1 a 2 das hbiles.  Para renovar recetas, por favor pida a su farmacia que se ponga en contacto con nuestra oficina. Nuestro nmero de fax es el [phone].  Si tiene un asunto urgente cuando la clnica est cerrada y que no puede esperar hasta el siguiente da hbil, puede llamar/localizar a su doctor(a) al nmero que aparece a continuacin.   Por favor, tenga en cuenta que aunque hacemos todo lo posible para estar disponibles para asuntos urgentes fuera del horario de oficina, no estamos disponibles las 24 horas del da, los 7 das de la semana.   Si tiene un problema urgente y   no puede comunicarse con nosotros, puede optar por buscar atencin mdica  en el consultorio de su doctor(a), en una clnica privada, en un centro de atencin urgente o en una sala de emergencias.  Si tiene una emergencia mdica, por favor llame inmediatamente al 911 o vaya a la sala de emergencias.  Nmeros de bper  - Dr. Kowalski: [phone]  - Dra. Moye: [phone]  - Dra. Stewart: [phone]  En caso de inclemencias del tiempo, por favor llame a nuestra lnea principal al [phone] para una actualizacin sobre el estado de cualquier retraso o cierre.  Consejos para la medicacin en dermatologa: Por favor, guarde las cajas en las que vienen los medicamentos de uso tpico para ayudarle a seguir las instrucciones sobre dnde y cmo usarlos. Las farmacias generalmente imprimen las instrucciones del medicamento slo en las cajas y no directamente en los tubos del medicamento.   Si su medicamento es muy caro, por favor, pngase en contacto con nuestra oficina llamando al [phone] y presione la opcin 4 o envenos un mensaje a travs de MyChart.   No  podemos decirle cul ser su copago por los medicamentos por adelantado ya que esto es diferente dependiendo de la cobertura de su seguro. Sin embargo, es posible que podamos encontrar un medicamento sustituto a menor costo o llenar un formulario para que el seguro cubra el medicamento que se considera necesario.   Si se requiere una autorizacin previa para que su compaa de seguros cubra su medicamento, por favor permtanos de 1 a 2 das hbiles para completar este proceso.  Los precios de los medicamentos varan con frecuencia dependiendo del lugar de dnde se surte la receta y alguna farmacias pueden ofrecer precios ms baratos.  El sitio web www.goodrx.com tiene cupones para medicamentos de diferentes farmacias. Los precios aqu no tienen en cuenta lo que podra costar con la ayuda del seguro (puede ser ms barato con su seguro), pero el sitio web puede darle el precio si no utiliz ningn seguro.  - Puede imprimir el cupn correspondiente y llevarlo con su receta a la farmacia.  - Tambin puede pasar por nuestra oficina durante el horario de atencin regular y recoger una tarjeta de cupones de GoodRx.  - Si necesita que su receta se enve electrnicamente a una farmacia diferente, informe a nuestra oficina a travs de MyChart de  o por telfono llamando al [phone] y presione la opcin 4.  

## 2022-07-22 NOTE — Progress Notes (Signed)
Follow-Up Visit   Subjective  Pamela Dillon is a 36 y.o. female who presents for the following: Annual Exam (Here for skin cancer screening. Full body. No personal hx of skin cancer or dysplastic nevi. Areas of concern today. Mother has hx of skin issues). The patient presents for Total-Body Skin Exam (TBSE) for skin cancer screening and mole check.  The patient has spots, moles and lesions to be evaluated, some may be new or changing and the patient has concerns that these could be cancer.  The following portions of the chart were reviewed this encounter and updated as appropriate:  Tobacco  Allergies  Meds  Problems  Med Hx  Surg Hx  Fam Hx     Review of Systems: No other skin or systemic complaints except as noted in HPI or Assessment and Plan.  Objective  Well appearing patient in no apparent distress; mood and affect are within normal limits.  A full examination was performed including scalp, head, eyes, ears, nose, lips, neck, chest, axillae, abdomen, back, buttocks, bilateral upper extremities, bilateral lower extremities, hands, feet, fingers, toes, fingernails, and toenails. All findings within normal limits unless otherwise noted below.  Neck x6 (6) Erythematous keratotic or waxy stuck-on papule or plaque.  Right Axilla Scattered tan macules.    Assessment & Plan   Lentigines - Scattered tan macules - Due to sun exposure - Benign-appearing, observe - Recommend daily broad spectrum sunscreen SPF 30+ to sun-exposed areas, reapply every 2 hours as needed. - Call for any changes  Melanocytic Nevi - Tan-brown and/or pink-flesh-colored symmetric macules and papules - Benign appearing on exam today - Observation - Call clinic for new or changing moles - Recommend daily use of broad spectrum spf 30+ sunscreen to sun-exposed areas.   Hemangiomas - Red papules - Discussed benign nature - Observe - Call for any changes  Acrochordons (Skin  Tags) - Fleshy, skin-colored pedunculated papules - Benign appearing.  - Observe. - If desired, they can be removed with an in office procedure that is not covered by insurance. - Please call the clinic if you notice any new or changing lesions.   Keratosis Pilaris - Tiny follicular keratotic papules - Benign. Genetic in nature. No cure. - Observe. - If desired, patient can use an emollient (moisturizer) containing ammonium lactate, urea or salicylic acid once a day to smooth the area  Skin cancer screening performed today.  Inflamed seborrheic keratosis (6) Neck x6 Symptomatic, irritating, patient would like treated. Destruction of lesion - Neck x6 Complexity: simple   Destruction method: cryotherapy   Informed consent: discussed and consent obtained   Timeout:  patient name, date of birth, surgical site, and procedure verified Lesion destroyed using liquid nitrogen: Yes   Region frozen until ice ball extended beyond lesion: Yes   Outcome: patient tolerated procedure well with no complications   Post-procedure details: wound care instructions given   Additional details:  Prior to procedure, discussed risks of blister formation, small wound, skin dyspigmentation, or rare scar following cryotherapy. Recommend Vaseline ointment to treated areas while healing.  Lentigines Right Axilla Axillary freckling No history of neurofibromatosis personally or family Benign-appearing.  Observation.  Call clinic for new or changing lesions.  Recommend daily use of broad spectrum spf 30+ sunscreen to sun-exposed areas.    Return in about 1 year (around 07/23/2023) for TBSE.  I, [name], CMA, am acting as scribe for [name], MD. Documentation: I have reviewed the above documentation for accuracy and  completeness, and I agree with the above.  [name], MD

## 2022-07-25 ENCOUNTER — Encounter: Payer: Self-pay | Admitting: Dermatology

## 2022-11-30 ENCOUNTER — Encounter: Payer: Self-pay | Admitting: Dermatology

## 2022-11-30 ENCOUNTER — Ambulatory Visit (INDEPENDENT_AMBULATORY_CARE_PROVIDER_SITE_OTHER): Payer: 59 | Admitting: Dermatology

## 2022-11-30 DIAGNOSIS — L2089 Other atopic dermatitis: Secondary | ICD-10-CM

## 2022-11-30 DIAGNOSIS — Z79899 Other long term (current) drug therapy: Secondary | ICD-10-CM

## 2022-11-30 DIAGNOSIS — L300 Nummular dermatitis: Secondary | ICD-10-CM

## 2022-11-30 MED ORDER — TRIAMCINOLONE ACETONIDE 0.1 % EX CREA: TOPICAL_CREAM | CUTANEOUS | 0 refills | Status: DC

## 2022-11-30 NOTE — Progress Notes (Signed)
   Follow-Up Visit   Subjective  Pamela Dillon is a 36 y.o. female who presents for the following: Rash (Top of right foot. Dur: ~1 month. Thought was going away but flared and is larger. Using OTC HC 1% cream. Itches at times).  The following portions of the chart were reviewed this encounter and updated as appropriate:  Tobacco  Allergies  Meds  Problems  Med Hx  Surg Hx  Fam Hx     Review of Systems: No other skin or systemic complaints except as noted in HPI or Assessment and Plan.  Objective  Well appearing patient in no apparent distress; mood and affect are within normal limits.  A focused examination was performed including right foot. Relevant physical exam findings are noted in the Assessment and Plan.  Right Foot - Anterior Scaly pink patch      Assessment & Plan  Nummular eczema Atopic dermatitis Right Foot - Anterior Chronic and persistent condition with duration or expected duration over one year. Condition is bothersome/symptomatic for patient. Currently flared.  Start Triamcinolone 0.1% cream twice daily 5 days a week until clear. 15gm, 0 Rf  Atopic dermatitis (eczema) is a chronic, relapsing, pruritic condition that can significantly affect quality of life. It is often associated with allergic rhinitis and/or asthma and can require treatment with topical medications, phototherapy, or in severe cases biologic injectable medication (Dupixent; Adbry) or Oral JAK inhibitors.  Topical steroids (such as triamcinolone, fluocinolone, fluocinonide, mometasone, clobetasol, halobetasol, betamethasone, hydrocortisone) can cause thinning and lightening of the skin if they are used for too long in the same area. Your physician has selected the right strength medicine for your problem and area affected on the body. Please use your medication only as directed by your physician to prevent side effects.   triamcinolone cream (KENALOG) 0.1 % - Right Foot -  Anterior Apply twice daily 5 days a week until clear.  Return if symptoms worsen or fail to improve.  I, Pamela Dillon, CMA, am acting as scribe for Pamela Ser, MD. Documentation: I have reviewed the above documentation for accuracy and completeness, and I agree with the above.  Pamela Ser, MD

## 2022-11-30 NOTE — Patient Instructions (Signed)
Start Triamcinolone 0.1% cream twice daily 5 days a week until clear. 15gm, 0 Rf  Topical steroids (such as triamcinolone, fluocinolone, fluocinonide, mometasone, clobetasol, halobetasol, betamethasone, hydrocortisone) can cause thinning and lightening of the skin if they are used for too long in the same area. Your physician has selected the right strength medicine for your problem and area affected on the body. Please use your medication only as directed by your physician to prevent side effects.     Gentle Skin Care Guide  1. Bathe no more than once a day.  2. Avoid bathing in hot water  3. Use a mild soap like Dove, Vanicream, Cetaphil, CeraVe. Can use Lever 2000 or Cetaphil antibacterial soap  4. Use soap only where you need it. On most days, use it under your arms, between your legs, and on your feet. Let the water rinse other areas unless visibly dirty.  5. When you get out of the bath/shower, use a towel to gently blot your skin dry, don't rub it.  6. While your skin is still a little damp, apply a moisturizing cream such as Vanicream, CeraVe, Cetaphil, Eucerin, Sarna lotion or plain Vaseline Jelly. For hands apply Neutrogena [place] Hand Cream or Excipial Hand Cream.  7. Reapply moisturizer any time you start to itch or feel dry.  8. Sometimes using free and clear laundry detergents can be helpful. Fabric softener sheets should be avoided. Downy Free & Gentle liquid, or any liquid fabric softener that is free of dyes and perfumes, it acceptable to use  9. If your doctor has given you prescription creams you may apply moisturizers over them   Due to recent changes in healthcare laws, you may see results of your pathology and/or laboratory studies on MyChart before the doctors have had a chance to review them. We understand that in some cases there may be results that are confusing or concerning to you. Please understand that not all results are received at the same time and often  the doctors may need to interpret multiple results in order to provide you with the best plan of care or course of treatment. Therefore, we ask that you please give [place] 2 business days to thoroughly review all your results before contacting the office for clarification. Should we see a critical lab result, you will be contacted sooner.   If You Need Anything After Your Visit  If you have any questions or concerns for your doctor, please call our main line at [phone] and press option 4 to reach your doctor's medical assistant. If no one answers, please leave a voicemail as directed and we will return your call as soon as possible. Messages left after 4 pm will be answered the following business day.   You may also send [place] a message via [place]. We typically respond to MyChart messages within 1-2 business days.  For prescription refills, please ask your pharmacy to contact our office. Our fax number is [phone].  If you have an urgent issue when the clinic is closed that cannot wait until the next business day, you can page your doctor at the number below.    Please note that while we do our best to be available for urgent issues outside of office hours, we are not available 24/7.   If you have an urgent issue and are unable to reach [place], you may choose to seek medical care at your doctor's office, retail clinic, urgent care center, or emergency room.  If you have  a medical emergency, please immediately call 911 or go to the emergency department.  Pager Numbers  - Dr. Nehemiah Massed: [phone]  - Dr. Laurence Ferrari: [phone]  - Dr. Nicole Kindred: [phone]  In the event of inclement weather, please call our main line at [phone] for an update on the status of any delays or closures.  Dermatology Medication Tips: Please keep the boxes that topical medications come in in order to help keep track of the instructions about where and how to use these. Pharmacies typically print the medication  instructions only on the boxes and not directly on the medication tubes.   If your medication is too expensive, please contact our office at [phone] option 4 or send [place] a message through [place].   We are unable to tell what your co-pay for medications will be in advance as this is different depending on your insurance coverage. However, we may be able to find a substitute medication at lower cost or fill out paperwork to get insurance to cover a needed medication.   If a prior authorization is required to get your medication covered by your insurance company, please allow [place] 1-2 business days to complete this process.  Drug prices often vary depending on where the prescription is filled and some pharmacies may offer cheaper prices.  The website www.goodrx.com contains coupons for medications through different pharmacies. The prices here do not account for what the cost may be with help from insurance (it may be cheaper with your insurance), but the website can give you the price if you did not use any insurance.  - You can print the associated coupon and take it with your prescription to the pharmacy.  - You may also stop by our office during regular business hours and pick up a GoodRx coupon card.  - If you need your prescription sent electronically to a different pharmacy, notify our office through [facility] or by phone at [phone] option 4.     Si Usted Necesita Algo Despus de Su Visita  Tambin puede enviarnos un mensaje a travs de Pharmacist, community. Por lo general respondemos a los mensajes de MyChart en el transcurso de 1 a 2 das hbiles.  Para renovar recetas, por favor pida a su farmacia que se ponga en contacto con nuestra oficina. Harland Dingwall de fax es [place] [phone].  Si tiene un asunto urgente cuando la clnica est cerrada y que no puede esperar hasta el siguiente da hbil, puede llamar/localizar a su doctor(a) al nmero que aparece a continuacin.   Por  favor, tenga en cuenta que aunque hacemos todo lo posible para estar disponibles para asuntos urgentes fuera del horario de [place], no estamos disponibles las 24 horas del da, los 7 das de la [place].   Si tiene un problema urgente y no puede comunicarse con nosotros, puede optar por buscar atencin mdica  en el consultorio de su doctor(a), en una clnica privada, en un centro de atencin urgente o en una sala de emergencias.  Si tiene Engineering geologist, por favor llame inmediatamente al 911 o vaya a la sala de emergencias.  Nmeros de bper  - Dr. Nehemiah Massed: [phone]  - Dra. Moye: [phone]  - Dra. Nicole Kindred: [phone]  En caso de inclemencias del [place], por favor llame a Johnsie Kindred principal al [phone] para una actualizacin sobre el [place] de cualquier retraso o cierre.  Consejos para la medicacin en dermatologa: Por favor, guarde las cajas en las que vienen los medicamentos de uso tpico para  ayudarle a seguir las [employer] dnde y cmo usarlos. Las farmacias generalmente imprimen las instrucciones del medicamento slo en las cajas y no directamente en los tubos del [place].   Si su medicamento es muy caro, por favor, pngase en contacto con Zigmund Daniel llamando al [phone] y presione la opcin 4 o envenos un mensaje a travs de Pharmacist, community.   No podemos decirle cul ser su copago por los medicamentos por adelantado ya que esto es diferente dependiendo de la cobertura de su seguro. Sin embargo, es posible que podamos encontrar un medicamento sustituto a Electrical engineer un formulario para que el seguro cubra el medicamento que se considera necesario.   Si se requiere una autorizacin previa para que su compaa de seguros [place] su medicamento, por favor permtanos de 1 a 2 das hbiles para completar este proceso.  Los precios de los medicamentos varan con frecuencia dependiendo del Environmental consultant de dnde se surte la receta y alguna farmacias  pueden ofrecer precios ms baratos.  El sitio web www.goodrx.com tiene cupones para medicamentos de Airline pilot. Los precios aqu no tienen en cuenta lo que podra costar con la ayuda del seguro (puede ser ms barato con su seguro), pero el sitio web puede darle el precio si no utiliz Research scientist (physical sciences).  - Puede imprimir el cupn correspondiente y llevarlo con su receta a la farmacia.  - Tambin puede pasar por nuestra oficina durante el horario de atencin regular y Charity fundraiser una tarjeta de cupones de GoodRx.  - Si necesita que su receta se enve electrnicamente a una farmacia diferente, informe a nuestra oficina a travs de MyChart de [place] o por telfono llamando al [phone] y presione la opcin 4.

## 2022-12-12 ENCOUNTER — Encounter: Payer: Self-pay | Admitting: Dermatology

## 2023-06-24 ENCOUNTER — Ambulatory Visit
Admission: EM | Admit: 2023-06-24 | Discharge: 2023-06-24 | Disposition: A | Discharge status: Home / Self Care | Payer: 59

## 2023-06-24 DIAGNOSIS — H60501 Unspecified acute noninfective otitis externa, right ear: Secondary | ICD-10-CM | POA: Diagnosis not present

## 2023-06-24 DIAGNOSIS — R030 Elevated blood-pressure reading, without diagnosis of hypertension: Secondary | ICD-10-CM

## 2023-06-24 MED ORDER — OFLOXACIN 0.3 % OT SOLN: 10.0000 [drp] | Freq: Every day | OTIC | 0 refills | Status: AC

## 2023-06-24 NOTE — Discharge Instructions (Addendum)
Use the ear drops as directed.  Follow up with your primary care provider if your symptoms are not improving.    Your blood pressure is elevated today at 130/90.  Please have this rechecked by your primary care provider in 2-4 weeks.

## 2023-06-24 NOTE — ED Triage Notes (Signed)
Patient to Urgent Care with complaints of right sided ear pain/ fullness. Tender to the touch. Reports muffled hearing. Has noticed some drainage. Difficult to lay on right side due to discomfort.  Symptoms started one week ago. Worsened over the last two days. Possible fever one night this week.  Taking tylenol and ibuprofen.

## 2023-06-24 NOTE — ED Provider Notes (Signed)
Pamela Dillon    CSN: [id number] Arrival date & time: 06/24/23  1119      History   Chief Complaint Chief Complaint  Patient presents with   Ear Fullness    Aching ear with muffle hearing and more liquid wax - Entered by patient    HPI Pamela Dillon is a 37 y.o. female.  Patient presents with right ear pain x 1 week.  She has had some ear drainage and her ear is painful with movement.  Treatment attempted with Tylenol.  She denies or throat, cough, shortness of breath, vomiting, diarrhea, or other symptoms.  Her medical history includes asthma, allergies, seizures.  The history is provided by the patient and medical records.    Past Medical History:  Diagnosis Date   Allergy    Asthma    Interstitial cystitis    Seizures (HCC)     Patient Active Problem List   Diagnosis Date Noted   Egg allergy 12/13/2019   GBS bacteriuria 07/24/2019   Focal nodular hyperplasia of liver 11/27/2018   Impingement syndrome of shoulder region 05/26/2018   Chronic seasonal allergic rhinitis due to fungal spores 10/11/2016   Extrinsic asthma 05/15/2015   Dyspnea 05/15/2015   Interstitial cystitis (chronic) without hematuria 05/13/2009    Past Surgical History:  Procedure Laterality Date   LAPAROSCOPY  2010   CYSTICS.   URETEROSCOPY  2010   WISDOM TOOTH EXTRACTION     X 3    OB History     Gravida  1   Para  0   Term  0   Preterm  0   AB  0   Living  0      SAB  0   IAB  0   Ectopic  0   Multiple  0   Live Births               Home Medications    Prior to Admission medications   Medication Sig Start Date End Date Taking? Authorizing Provider  ofloxacin (FLOXIN) 0.3 % OTIC solution Place 10 drops into the right ear daily for 7 days. 06/24/23 07/01/23 Yes Mickie Bail, NP  phentermine (ADIPEX-P) 37.5 MG tablet Take by mouth. 06/22/23 07/22/23 Yes [provider]  Acetaminophen 500 MG capsule Take by mouth.    [provider]  albuterol (PROAIR HFA) 108 (90 BASE) MCG/ACT inhaler Inhale 2 puffs into the lungs every 4 (four) hours as needed for wheezing or shortness of breath. 10/03/15   Lupita Leash, MD  amoxicillin (AMOXIL) 500 MG capsule Take 1 capsule (500 mg total) by mouth 2 (two) times daily. 11/13/21   Felecia Shelling, DPM  budesonide-formoterol (SYMBICORT) 80-4.5 MCG/ACT inhaler Inhale 80 mcg into the lungs 2 (two) times daily. 03/17/15   [provider]  fluticasone Aleda Grana) 50 MCG/ACT nasal spray fluticasone propionate 50 mcg/actuation nasal spray,suspension 01/02/18   [provider]  gentamicin cream (GARAMYCIN) 0.1 % Apply 1 application topically 2 (two) times daily. 10/16/21   Felecia Shelling, DPM  LORazepam (ATIVAN) 0.5 MG tablet Take by mouth. 11/25/20   [provider]  meloxicam (MOBIC) 15 MG tablet TAKE 1 TABLET (15 MG TOTAL) BY MOUTH DAILY. 01/10/22   Felecia Shelling, DPM  methocarbamol (ROBAXIN) 500 MG tablet Take 1 tablet by mouth 3 (three) times daily. 11/28/20   [provider]  methylPREDNISolone (MEDROL DOSEPAK) 4 MG TBPK tablet 6 day dose pack - take as directed  Patient not taking: Reported on 08/17/2021 07/21/21   Felecia Shelling, DPM  montelukast (SINGULAIR) 10 MG tablet TAKE 1 TABLET BY MOUTH EVERY DAY EVERY NIGHT 10/30/18   [provider]  Spacer/Aero-Holding Chambers (AEROCHAMBER MV) inhaler Use as instructed 05/29/15   Lupita Leash, MD  triamcinolone cream (KENALOG) 0.1 % Apply twice daily 5 days a week until clear. 11/30/22   Sandi Mealy, MD    Family History Family History  Problem Relation Age of Onset   Multiple sclerosis Mother    Cancer Maternal Grandmother        BREAST.   Cancer Maternal Grandfather        THYRIOD.   Cancer Paternal Grandmother        LUNG   Stroke Paternal Grandfather    Heart disease Paternal Grandfather        HEART ATTACK.    Social History Social History   Tobacco Use   Smoking status: Never    Smokeless tobacco: Never  Vaping Use   Vaping Use: Never used  Substance Use Topics   Alcohol use: Yes    Alcohol/week: 0.0 standard drinks of alcohol    Comment: occassionally   Drug use: No     Allergies   Demerol [meperidine], Meperidine hcl, Diphenhydramine, Doxycycline, and Sulfa antibiotics   Review of Systems Review of Systems  Constitutional:  Negative for chills and fever.  HENT:  Positive for ear discharge and ear pain. Negative for sore throat.   Respiratory:  Negative for cough and shortness of breath.   Cardiovascular:  Negative for chest pain and palpitations.  Gastrointestinal:  Negative for diarrhea and vomiting.  Skin:  Negative for color change and rash.     Physical Exam Triage Vital Signs ED Triage Vitals  Enc Vitals Group     BP      Pulse      Resp      Temp      Temp src      SpO2      Weight      Height      Head Circumference      Peak Flow      Pain Score      Pain Loc      Pain Edu?      Excl. in GC?    No data found.  Updated Vital Signs BP (!) 130/90   Pulse 88   Temp 97.6 F (36.4 C)   Resp 18   LMP 06/21/2023   SpO2 97%   Visual Acuity Right Eye Distance:   Left Eye Distance:   Bilateral Distance:    Right Eye Near:   Left Eye Near:    Bilateral Near:     Physical Exam Constitutional:      General: She is not in acute distress. HENT:     Right Ear: Tympanic membrane normal. Drainage and tenderness present.     Left Ear: Tympanic membrane and ear canal normal.     Nose: Nose normal.     Mouth/Throat:     Mouth: Mucous membranes are moist.     Pharynx: Oropharynx is clear.  Cardiovascular:     Rate and Rhythm: Normal rate and regular rhythm.     Heart sounds: Normal heart sounds.  Pulmonary:     Effort: Pulmonary effort is normal. No respiratory distress.     Breath sounds: Normal breath sounds.  Skin:    General: Skin is warm and dry.  Findings: No rash.  Neurological:     Mental Status: She is  alert.  Psychiatric:        Mood and Affect: Mood normal.        Behavior: Behavior normal.      UC Treatments / Results  Labs (all labs ordered are listed, but only abnormal results are displayed) Labs Reviewed - No data to display  EKG   Radiology No results found.  Procedures Procedures (including critical care time)  Medications Ordered in UC Medications - No data to display  Initial Impression / Assessment and Plan / UC Course  I have reviewed the triage vital signs and the nursing notes.  Pertinent labs & imaging results that were available during my care of the patient were reviewed by me and considered in my medical decision making (see chart for details).    Right otitis externa, elevated blood pressure reading.  Treating with ofloxacin ear drops.  Instructed patient to follow up with her PCP if her symptoms are not improving.  Also discussed with patient that her blood pressure is elevated today and needs to be rechecked by PCP in 2 to 4 weeks.  Education provided on preventing hypertension.  She agrees to plan of care.    Final Clinical Impressions(s) / UC Diagnoses   Final diagnoses:  Acute otitis externa of right ear, unspecified type  Elevated blood pressure reading     Discharge Instructions      Use the ear drops as directed.  Follow up with your primary care provider if your symptoms are not improving.    Your blood pressure is elevated today at 130/90.  Please have this rechecked by your primary care provider in 2-4 weeks.          ED Prescriptions     Medication Sig Dispense Auth. Provider   ofloxacin (FLOXIN) 0.3 % OTIC solution Place 10 drops into the right ear daily for 7 days. 5 mL Mickie Bail, NP      PDMP not reviewed this encounter.   Mickie Bail, NP 06/24/23 1147

## 2023-07-07 ENCOUNTER — Ambulatory Visit (INDEPENDENT_AMBULATORY_CARE_PROVIDER_SITE_OTHER): Payer: 59 | Admitting: Dermatology

## 2023-07-07 VITALS — BP 130/78 | HR 70

## 2023-07-07 DIAGNOSIS — L578 Other skin changes due to chronic exposure to nonionizing radiation: Secondary | ICD-10-CM

## 2023-07-07 DIAGNOSIS — L300 Nummular dermatitis: Secondary | ICD-10-CM

## 2023-07-07 DIAGNOSIS — L719 Rosacea, unspecified: Secondary | ICD-10-CM

## 2023-07-07 DIAGNOSIS — Z1283 Encounter for screening for malignant neoplasm of skin: Secondary | ICD-10-CM

## 2023-07-07 DIAGNOSIS — L820 Inflamed seborrheic keratosis: Secondary | ICD-10-CM | POA: Diagnosis not present

## 2023-07-07 DIAGNOSIS — L858 Other specified epidermal thickening: Secondary | ICD-10-CM

## 2023-07-07 DIAGNOSIS — L219 Seborrheic dermatitis, unspecified: Secondary | ICD-10-CM

## 2023-07-07 DIAGNOSIS — Z79899 Other long term (current) drug therapy: Secondary | ICD-10-CM

## 2023-07-07 DIAGNOSIS — D225 Melanocytic nevi of trunk: Secondary | ICD-10-CM

## 2023-07-07 DIAGNOSIS — L812 Freckles: Secondary | ICD-10-CM

## 2023-07-07 DIAGNOSIS — D229 Melanocytic nevi, unspecified: Secondary | ICD-10-CM

## 2023-07-07 DIAGNOSIS — L814 Other melanin hyperpigmentation: Secondary | ICD-10-CM

## 2023-07-07 DIAGNOSIS — L738 Other specified follicular disorders: Secondary | ICD-10-CM

## 2023-07-07 DIAGNOSIS — L304 Erythema intertrigo: Secondary | ICD-10-CM

## 2023-07-07 DIAGNOSIS — L209 Atopic dermatitis, unspecified: Secondary | ICD-10-CM

## 2023-07-07 DIAGNOSIS — W908XXA Exposure to other nonionizing radiation, initial encounter: Secondary | ICD-10-CM

## 2023-07-07 DIAGNOSIS — L821 Other seborrheic keratosis: Secondary | ICD-10-CM

## 2023-07-07 DIAGNOSIS — L2089 Other atopic dermatitis: Secondary | ICD-10-CM

## 2023-07-07 MED ORDER — KETOCONAZOLE 2 % EX SHAM: MEDICATED_SHAMPOO | CUTANEOUS | 6 refills | Status: DC

## 2023-07-07 MED ORDER — TRIAMCINOLONE ACETONIDE 0.1 % EX CREA
TOPICAL_CREAM | CUTANEOUS | 3 refills | Status: AC
Start: 2023-07-07 — End: ?

## 2023-07-07 MED ORDER — TRIAMCINOLONE ACETONIDE 0.1 % EX CREA: TOPICAL_CREAM | CUTANEOUS | 3 refills | Status: DC

## 2023-07-07 NOTE — Progress Notes (Deleted)
   Follow-Up Visit   Subjective  Pamela Dillon is a 37 y.o. female who presents for the following: Skin Cancer Screening and Full Body Skin Exam  The patient presents for Total-Body Skin Exam (TBSE) for skin cancer screening and mole check. The patient has spots, moles and lesions to be evaluated, some may be new or changing and the patient may have concern these could be cancer.    The following portions of the chart were reviewed this encounter and updated as appropriate: medications, allergies, medical history  Review of Systems:  No other skin or systemic complaints except as noted in HPI or Assessment and Plan.  Objective  Well appearing patient in no apparent distress; mood and affect are within normal limits.  A full examination was performed including scalp, head, eyes, ears, nose, lips, neck, chest, axillae, abdomen, back, buttocks, bilateral upper extremities, bilateral lower extremities, hands, feet, fingers, toes, fingernails, and toenails. All findings within normal limits unless otherwise noted below.   Relevant physical exam findings are noted in the Assessment and Plan.    Assessment & Plan   SKIN CANCER SCREENING PERFORMED TODAY.  ACTINIC DAMAGE - Chronic condition, secondary to cumulative UV/sun exposure - diffuse scaly erythematous macules with underlying dyspigmentation - Recommend daily broad spectrum sunscreen SPF 30+ to sun-exposed areas, reapply every 2 hours as needed.  - Staying in the shade or wearing long sleeves, sun glasses (UVA+UVB protection) and wide brim hats (4-inch brim around the entire circumference of the hat) are also recommended for sun protection.  - Call for new or changing lesions.  LENTIGINES, SEBORRHEIC KERATOSES, HEMANGIOMAS - Benign normal skin lesions - Benign-appearing - Call for any changes  MELANOCYTIC NEVI - Tan-brown and/or pink-flesh-colored symmetric macules and papules - Benign appearing on exam  today - Observation - Call clinic for new or changing moles - Recommend daily use of broad spectrum spf 30+ sunscreen to sun-exposed areas.       Nummular eczema  Related Medications triamcinolone cream (KENALOG) 0.1 % Apply twice daily 5 days a week until clear.   No follow-ups on file.  ***  Documentation: I have reviewed the above documentation for accuracy and completeness, and I agree with the above.  Armida Sans, MD

## 2023-07-07 NOTE — Patient Instructions (Addendum)
Due to recent changes in healthcare laws, you may see results of your pathology and/or laboratory studies on MyChart before the doctors have had a chance to review them. We understand that in some cases there may be results that are confusing or concerning to you. Please understand that not all results are received at the same time and often the doctors may need to interpret multiple results in order to provide you with the best plan of care or course of treatment. Therefore, we ask that you please give us 2 business days to thoroughly review all your results before contacting the office for clarification. Should we see a critical lab result, you will be contacted sooner.   If You Need Anything After Your Visit  If you have any questions or concerns for your doctor, please call our main line at [phone] and press option 4 to reach your doctor's medical assistant. If no one answers, please leave a voicemail as directed and we will return your call as soon as possible. Messages left after 4 pm will be answered the following business day.   You may also send us a message via MyChart. We typically respond to MyChart messages within 1-2 business days.  For prescription refills, please ask your pharmacy to contact our office. Our fax number is [phone].  If you have an urgent issue when the clinic is closed that cannot wait until the next business day, you can page your doctor at the number below.    Please note that while we do our best to be available for urgent issues outside of office hours, we are not available 24/7.   If you have an urgent issue and are unable to reach us, you may choose to seek medical care at your doctor's office, retail clinic, urgent care center, or emergency room.  If you have a medical emergency, please immediately call 911 or go to the emergency department.  Pager Numbers  - Dr. Kowalski: [phone]  - Dr. Moye: [phone]  - Dr. Stewart:  [phone]  In the event of inclement weather, please call our main line at [phone] for an update on the status of any delays or closures.  Dermatology Medication Tips: Please keep the boxes that topical medications come in in order to help keep track of the instructions about where and how to use these. Pharmacies typically print the medication instructions only on the boxes and not directly on the medication tubes.   If your medication is too expensive, please contact our office at [phone] option 4 or send us a message through MyChart.   We are unable to tell what your co-pay for medications will be in advance as this is different depending on your insurance coverage. However, we may be able to find a substitute medication at lower cost or fill out paperwork to get insurance to cover a needed medication.   If a prior authorization is required to get your medication covered by your insurance company, please allow us 1-2 business days to complete this process.  Drug prices often vary depending on where the prescription is filled and some pharmacies may offer cheaper prices.  The website www.goodrx.com contains coupons for medications through different pharmacies. The prices here do not account for what the cost may be with help from insurance (it may be cheaper with your insurance), but the website can give you the price if you did not use any insurance.  - You can print the associated coupon and take it with   your prescription to the pharmacy.  - You may also stop by our office during regular business hours and pick up a GoodRx coupon card.  - If you need your prescription sent electronically to a different pharmacy, notify our office through [facility] MyChart or by phone at [phone] option 4.     Si Usted Necesita Algo Despus de Su Visita  Tambin puede enviarnos un mensaje a travs de MyChart. Por lo general respondemos a los mensajes de MyChart en el transcurso de 1 a 2  das hbiles.  Para renovar recetas, por favor pida a su farmacia que se ponga en contacto con nuestra oficina. Nuestro nmero de fax es el [phone].  Si tiene un asunto urgente cuando la clnica est cerrada y que no puede esperar hasta el siguiente da hbil, puede llamar/localizar a su doctor(a) al nmero que aparece a continuacin.   Por favor, tenga en cuenta que aunque hacemos todo lo posible para estar disponibles para asuntos urgentes fuera del horario de oficina, no estamos disponibles las 24 horas del da, los 7 das de la semana.   Si tiene un problema urgente y no puede comunicarse con nosotros, puede optar por buscar atencin mdica  en el consultorio de su doctor(a), en una clnica privada, en un centro de atencin urgente o en una sala de emergencias.  Si tiene una emergencia mdica, por favor llame inmediatamente al 911 o vaya a la sala de emergencias.  Nmeros de bper  - Dr. Kowalski: [phone]  - Dra. Moye: [phone]  - Dra. Stewart: [phone]  En caso de inclemencias del tiempo, por favor llame a nuestra lnea principal al [phone] para una actualizacin sobre el estado de cualquier retraso o cierre.  Consejos para la medicacin en dermatologa: Por favor, guarde las cajas en las que vienen los medicamentos de uso tpico para ayudarle a seguir las instrucciones sobre dnde y cmo usarlos. Las farmacias generalmente imprimen las instrucciones del medicamento slo en las cajas y no directamente en los tubos del medicamento.   Si su medicamento es muy caro, por favor, pngase en contacto con nuestra oficina llamando al [phone] y presione la opcin 4 o envenos un mensaje a travs de MyChart.   No podemos decirle cul ser su copago por los medicamentos por adelantado ya que esto es diferente dependiendo de la cobertura de su seguro. Sin embargo, es posible que podamos encontrar un medicamento sustituto a menor costo o llenar un formulario para que el  seguro cubra el medicamento que se considera necesario.   Si se requiere una autorizacin previa para que su compaa de seguros cubra su medicamento, por favor permtanos de 1 a 2 das hbiles para completar este proceso.  Los precios de los medicamentos varan con frecuencia dependiendo del lugar de dnde se surte la receta y alguna farmacias pueden ofrecer precios ms baratos.  El sitio web www.goodrx.com tiene cupones para medicamentos de diferentes farmacias. Los precios aqu no tienen en cuenta lo que podra costar con la ayuda del seguro (puede ser ms barato con su seguro), pero el sitio web puede darle el precio si no utiliz ningn seguro.  - Puede imprimir el cupn correspondiente y llevarlo con su receta a la farmacia.  - Tambin puede pasar por nuestra oficina durante el horario de atencin regular y recoger una tarjeta de cupones de GoodRx.  - Si necesita que su receta se enve electrnicamente a una farmacia diferente, informe a nuestra oficina a travs de MyChart de [place]   o por telfono llamando al [phone] y presione la opcin 4.  

## 2023-07-07 NOTE — Progress Notes (Signed)
Follow-Up Visit   Subjective  Pamela Dillon is a 37 y.o. female who presents for the following: Skin Cancer Screening and Full Body Skin Exam, patient using Skin medicinals intertrigo cream for rash under her arms with a good response, using Triamcinolone cream for rash on her hands and feet with a good response.  The patient presents for Total-Body Skin Exam (TBSE) for skin cancer screening and mole check. The patient has spots, moles and lesions to be evaluated, some may be new or changing and the patient may have concern these could be cancer.  The following portions of the chart were reviewed this encounter and updated as appropriate: medications, allergies, medical history  Review of Systems:  No other skin or systemic complaints except as noted in HPI or Assessment and Plan.  Objective  Well appearing patient in no apparent distress; mood and affect are within normal limits.  A full examination was performed including scalp, head, eyes, ears, nose, lips, neck, chest, axillae, abdomen, back, buttocks, bilateral upper extremities, bilateral lower extremities, hands, feet, fingers, toes, fingernails, and toenails. All findings within normal limits unless otherwise noted below.   Relevant physical exam findings are noted in the Assessment and Plan.  neck x 4 Stuck-on, waxy, tan-brown papules --Discussed benign etiology and prognosis.    Assessment & Plan   Inflamed seborrheic keratosis neck x 4  Symptomatic, irritating, patient would like treated.   Destruction of lesion - neck x 4 Complexity: simple   Destruction method: cryotherapy   Informed consent: discussed and consent obtained   Timeout:  patient name, date of birth, surgical site, and procedure verified Lesion destroyed using liquid nitrogen: Yes   Region frozen until ice ball extended beyond lesion: Yes   Outcome: patient tolerated procedure well with no complications   Post-procedure details:  wound care instructions given    Nummular eczema  Related Medications triamcinolone cream (KENALOG) 0.1 % Apply twice daily 5 days a week until clear.   SKIN CANCER SCREENING PERFORMED TODAY.  ACTINIC DAMAGE - Chronic condition, secondary to cumulative UV/sun exposure - diffuse scaly erythematous macules with underlying dyspigmentation - Recommend daily broad spectrum sunscreen SPF 30+ to sun-exposed areas, reapply every 2 hours as needed.  - Staying in the shade or wearing long sleeves, sun glasses (UVA+UVB protection) and wide brim hats (4-inch brim around the entire circumference of the hat) are also recommended for sun protection.  - Call for new or changing lesions.  LENTIGINES, SEBORRHEIC KERATOSES, HEMANGIOMAS - Benign normal skin lesions - Benign-appearing - Call for any changes  MELANOCYTIC NEVI Right chest within tattoo- flesh papule  - Benign appearing on exam today - Observation - Call clinic for new or changing moles - Recommend daily use of broad spectrum spf 30+ sunscreen to sun-exposed areas.   Atopic dermatitis Hands and feet Mainly clear today  Chronic and persistent condition with duration or expected duration over one year. Condition is bothersome/symptomatic for patient. Currently flared. Continue  Triamcinolone 0.1% cream twice daily 5 days a week prn.   Atopic dermatitis (eczema) is a chronic, relapsing, pruritic condition that can significantly affect quality of life. It is often associated with allergic rhinitis and/or asthma and can require treatment with topical medications, phototherapy, or in severe cases biologic injectable medication (Dupixent; Adbry) or Oral JAK inhibitors.   Topical steroids (such as triamcinolone, fluocinolone, fluocinonide, mometasone, clobetasol, halobetasol, betamethasone, hydrocortisone) can cause thinning and lightening of the skin if they are used for too long in  the same area. Your physician has selected the right strength  medicine for your problem and area affected on the body. Please use your medication only as directed by your physician to prevent side effects.   Intertrigo inframammary, buttocks Intertrigo is a chronic recurrent rash that occurs in skin fold areas that may be associated with friction; heat; moisture; yeast; fungus; and bacteria.  It is exacerbated by increased movement / activity; sweating; and higher atmospheric temperature.   Continue Skin Medicinals Iodoquinol 1%, Hydrocortisone 2.5%, Niacinamide 2% Cream once a day to affected areas for up to two weeks.  The patient was advised this is not covered by insurance since it is made by a compounding pharmacy. They will receive an email to check out and the medication will be mailed to their home.     Continue Zeasorb AF powder qd  SEBORRHEIC DERMATITIS Exam: Pink patches with greasy scale at scalp Chronic and persistent condition with duration or expected duration over one year. Condition is symptomatic/ bothersome to patient. Not currently at goal.  Seborrheic Dermatitis is a chronic persistent rash characterized by pinkness and scaling most commonly of the mid face but also can occur on the scalp (dandruff), ears; mid chest, mid back and groin.  It tends to be exacerbated by stress and cooler weather.  People who have neurologic disease may experience new onset or exacerbation of existing seborrheic dermatitis.  The condition is not curable but treatable and can be controlled. Treatment Plan: Start Ketoconazole shampoo apply three times per week, massage into scalp and leave in for 10 minutes before rinsing out    Sebaceous Hyperplasia Face - Small yellow papules with a central dell - Benign-appearing - Observe. Call for changes.   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 No cure. - Observe. - If desired, patient can use an emollient (moisturizer) containing ammonium lactate (AmLactin),  urea or salicylic acid once a day to smooth the area  Recommend starting moisturizer with exfoliant (Urea, Salicylic acid, or Lactic acid) one to two times daily to help smooth rough and bumpy skin.  OTC options include Cetaphil Rough and Bumpy lotion (Urea), Eucerin Roughness Relief lotion or spot treatment cream (Urea), CeraVe SA lotion/cream for Rough and Bumpy skin (Sal Acid), [place] Rough and Bumpy cream (Sal Acid), and AmLactin 12% lotion/cream (Lactic Acid).  If applying in morning, also apply sunscreen to sun-exposed areas, since these exfoliating moisturizers can increase sensitivity to sun.    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 Currently well-controlled.  Continue Skin Medicinals metronidazole/ivermectin/azelaic acid twice daily as needed to affected areas on the face. The patient was advised this is not covered by insurance since it is made by a compounding pharmacy. They will receive an email to check out and the medication will be mailed to their home.    Long term medication management.  Patient is using long term (months to years) prescription medication  to control their dermatologic condition.  These medications require periodic monitoring to evaluate for efficacy and side effects and may require periodic laboratory monitoring.   Return in about 1 year (around 07/06/2024) for TBSE.  IAngelique Holm, CMA, am acting as scribe for Armida Sans, MD .   Documentation: I have reviewed the above documentation for accuracy and completeness, and I agree with the above.  Onalee Hua  Gwen Pounds, MD

## 2023-07-08 ENCOUNTER — Encounter: Payer: Self-pay | Admitting: Dermatology

## 2024-06-11 ENCOUNTER — Encounter: Payer: Self-pay | Admitting: Dermatology

## 2024-06-11 ENCOUNTER — Ambulatory Visit: Admitting: Dermatology

## 2024-06-11 DIAGNOSIS — D1801 Hemangioma of skin and subcutaneous tissue: Secondary | ICD-10-CM

## 2024-06-11 DIAGNOSIS — L219 Seborrheic dermatitis, unspecified: Secondary | ICD-10-CM

## 2024-06-11 DIAGNOSIS — L578 Other skin changes due to chronic exposure to nonionizing radiation: Secondary | ICD-10-CM

## 2024-06-11 DIAGNOSIS — D229 Melanocytic nevi, unspecified: Secondary | ICD-10-CM

## 2024-06-11 DIAGNOSIS — L700 Acne vulgaris: Secondary | ICD-10-CM | POA: Diagnosis not present

## 2024-06-11 DIAGNOSIS — D369 Benign neoplasm, unspecified site: Secondary | ICD-10-CM

## 2024-06-11 DIAGNOSIS — L719 Rosacea, unspecified: Secondary | ICD-10-CM

## 2024-06-11 DIAGNOSIS — L814 Other melanin hyperpigmentation: Secondary | ICD-10-CM

## 2024-06-11 DIAGNOSIS — L680 Hirsutism: Secondary | ICD-10-CM

## 2024-06-11 DIAGNOSIS — Z1283 Encounter for screening for malignant neoplasm of skin: Secondary | ICD-10-CM | POA: Diagnosis not present

## 2024-06-11 DIAGNOSIS — L300 Nummular dermatitis: Secondary | ICD-10-CM

## 2024-06-11 DIAGNOSIS — L209 Atopic dermatitis, unspecified: Secondary | ICD-10-CM

## 2024-06-11 DIAGNOSIS — L821 Other seborrheic keratosis: Secondary | ICD-10-CM

## 2024-06-11 DIAGNOSIS — L304 Erythema intertrigo: Secondary | ICD-10-CM

## 2024-06-11 DIAGNOSIS — W908XXA Exposure to other nonionizing radiation, initial encounter: Secondary | ICD-10-CM

## 2024-06-11 MED ORDER — TRIAMCINOLONE ACETONIDE 0.1 % EX CREA
TOPICAL_CREAM | CUTANEOUS | 3 refills | Status: AC
Start: 2024-06-11 — End: ?

## 2024-06-11 MED ORDER — EFLORNITHINE HCL 13.9 % EX CREA: TOPICAL_CREAM | CUTANEOUS | 11 refills | Status: AC

## 2024-06-11 MED ORDER — HYDROCORTISONE 2.5 % EX CREA: TOPICAL_CREAM | Freq: Two times a day (BID) | CUTANEOUS | 11 refills | Status: AC | PRN

## 2024-06-11 MED ORDER — AZELAIC ACID 15 % EX GEL: CUTANEOUS | 11 refills | Status: AC

## 2024-06-11 MED ORDER — KETOCONAZOLE 2 % EX SHAM: MEDICATED_SHAMPOO | CUTANEOUS | 6 refills | Status: AC

## 2024-06-11 NOTE — Patient Instructions (Addendum)
 [facility]  [address] [place], [place] [address]  Phone: [phone]   Due to recent changes in healthcare laws, you may see results of your pathology and/or laboratory studies on MyChart before the doctors have had a chance to review them. We understand that in some cases there may be results that are confusing or concerning to you. Please understand that not all results are received at the same time and often the doctors may need to interpret multiple results in order to provide you with the best plan of care or course of treatment. Therefore, we ask that you please give us  2 business days to thoroughly review all your results before contacting the office for clarification. Should we see a critical lab result, you will be contacted sooner.   If You Need Anything After Your Visit  If you have any questions or concerns for your doctor, please call our main line at [phone] and press option 4 to reach your doctor's medical assistant. If no one answers, please leave a voicemail as directed and we will return your call as soon as possible. Messages left after 4 pm will be answered the following business day.   You may also send us  a message via MyChart. We typically respond to MyChart messages within 1-2 business days.  For prescription refills, please ask your pharmacy to contact our office. Our fax number is [phone].  If you have an urgent issue when the clinic is closed that cannot wait until the next business day, you can page your doctor at the number below.    Please note that while we do our best to be available for urgent issues outside of office hours, we are not available 24/7.   If you have an urgent issue and are unable to reach us , you may choose to seek medical care at your doctor's office, retail clinic, urgent care center, or emergency room.  If you have a medical emergency, please immediately call 911 or go to the emergency department.  Pager  Numbers  - Dr. Bary Likes: [phone]  - Dr. Annette Barters: [phone]  - Dr. Felipe Horton: [phone]   In the event of inclement weather, please call our main line at [phone] for an update on the status of any delays or closures.  Dermatology Medication Tips: Please keep the boxes that topical medications come in in order to help keep track of the instructions about where and how to use these. Pharmacies typically print the medication instructions only on the boxes and not directly on the medication tubes.   If your medication is too expensive, please contact our office at [phone] option 4 or send us  a message through MyChart.   We are unable to tell what your co-pay for medications will be in advance as this is different depending on your insurance coverage. However, we may be able to find a substitute medication at lower cost or fill out paperwork to get insurance to cover a needed medication.   If a prior authorization is required to get your medication covered by your insurance company, please allow us  1-2 business days to complete this process.  Drug prices often vary depending on where the prescription is filled and some pharmacies may offer cheaper prices.  The website www.goodrx.com contains coupons for medications through different pharmacies. The prices here do not account for what the cost may be with help from insurance (it may be cheaper with your insurance), but the website can give you the price if you did not  use any insurance.  - You can print the associated coupon and take it with your prescription to the pharmacy.  - You may also stop by our office during regular business hours and pick up a GoodRx coupon card.  - If you need your prescription sent electronically to a different pharmacy, notify our office through [facility] or by phone at [phone] option 4.     Si Usted Necesita Algo Despus de Su Visita  Tambin puede enviarnos un mensaje a travs de  Clinical cytogeneticist. Por lo general respondemos a los mensajes de MyChart en el transcurso de 1 a 2 das hbiles.  Para renovar recetas, por favor pida a su farmacia que se ponga en contacto con nuestra oficina. Franz Jacks de fax es [place] [phone].  Si tiene un asunto urgente cuando la clnica est cerrada y que no puede esperar hasta el siguiente da hbil, puede llamar/localizar a su doctor(a) al nmero que aparece a continuacin.   Por favor, tenga en cuenta que aunque hacemos todo lo posible para estar disponibles para asuntos urgentes fuera del horario de [place], no estamos disponibles las 24 horas del da, los 7 [address]  [address] de la [place].   Si tiene un problema urgente y no puede comunicarse con nosotros, puede optar por buscar atencin mdica  en el consultorio de su doctor(a), en una clnica privada, en un centro de atencin urgente o en una sala de emergencias.  Si tiene Engineer, drilling, por favor llame inmediatamente al 911 o vaya a la sala de emergencias.  Nmeros de bper  - Dr. Bary Likes: [phone]  - Dra. Annette Barters: [phone]  - Dr. Felipe Horton: [phone]   En caso de inclemencias del tiempo, por favor llame a Lajuan Pila principal al [phone] para una actualizacin sobre el [place] de cualquier retraso o cierre.  Consejos para la medicacin en dermatologa: Por favor, guarde las cajas en las que vienen los medicamentos de uso tpico para ayudarle a seguir las instrucciones sobre dnde y cmo usarlos. Las farmacias generalmente imprimen las instrucciones del medicamento slo en las cajas y no directamente en los tubos del [place].   Si su medicamento es muy caro, por favor, pngase en contacto con Bettyjane Brunet llamando al [phone] y presione la opcin 4 o envenos un mensaje a travs de Clinical cytogeneticist.   No podemos decirle cul ser su copago por los medicamentos por adelantado ya que esto es diferente dependiendo de la cobertura de su seguro. Sin embargo, es posible que  podamos encontrar un medicamento sustituto a Audiological scientist un formulario para que el seguro cubra el medicamento que se considera necesario.   Si se requiere una autorizacin previa para que su compaa de seguros [place] su medicamento, por favor permtanos de 1 a 2 das hbiles para completar este proceso.  Los precios de los medicamentos varan con frecuencia dependiendo del Environmental consultant de dnde se surte la receta y alguna farmacias pueden ofrecer precios ms baratos.  El sitio web www.goodrx.com tiene cupones para medicamentos de Health and safety inspector. Los precios aqu no tienen en cuenta lo que podra costar con la ayuda del seguro (puede ser ms barato con su seguro), pero el sitio web puede darle el precio si no utiliz Tourist information centre manager.  - Puede imprimir el cupn correspondiente y llevarlo con su receta a la farmacia.  - Tambin puede pasar por nuestra oficina durante el horario de atencin regular y Education officer, museum una tarjeta de cupones de GoodRx.  - Si necesita que su receta se enve  electrnicamente a [place] diferente, informe a nuestra oficina a travs de MyChart de [place] o por telfono llamando al [phone] y presione la opcin 4.

## 2024-06-11 NOTE — Progress Notes (Addendum)
 Follow-Up Visit   Subjective  Pamela Dillon is a 38 y.o. female who presents for the following: Skin Cancer Screening and Full Body Skin Exam  The patient presents for Total-Body Skin Exam (TBSE) for skin cancer screening and mole check. The patient has spots, moles and lesions to be evaluated, some may be new or changing and the patient may have concern these could be cancer.  Pt has noticed an irregular and changing skin lesion on the R upper back, irritated skin lesions on the neck, and would like refills of medications.  The following portions of the chart were reviewed this encounter and updated as appropriate: medications, allergies, medical history  Review of Systems:  No other skin or systemic complaints except as noted in HPI or Assessment and Plan.  Objective  Well appearing patient in no apparent distress; mood and affect are within normal limits.  A full examination was performed including scalp, head, eyes, ears, nose, lips, neck, chest, axillae, abdomen, back, buttocks, bilateral upper extremities, bilateral lower extremities, hands, feet, fingers, toes, fingernails, and toenails. All findings within normal limits unless otherwise noted below.   Relevant physical exam findings are noted in the Assessment and Plan.  R mid back Open comedone. Face Terminal hairs on submentum and chin  Assessment & Plan   SKIN CANCER SCREENING PERFORMED TODAY.  ACTINIC DAMAGE - Chronic condition, secondary to cumulative UV/sun exposure - diffuse scaly erythematous macules with underlying dyspigmentation - Recommend daily broad spectrum sunscreen SPF 30+ to sun-exposed areas, reapply every 2 hours as needed.  - Staying in the shade or wearing long sleeves, sun glasses (UVA+UVB protection) and wide brim hats (4-inch brim around the entire circumference of the hat) are also recommended for sun protection.  - Call for new or changing lesions.  LENTIGINES, SEBORRHEIC  KERATOSES, HEMANGIOMAS - Benign normal skin lesions - Benign-appearing - Call for any changes  MELANOCYTIC NEVI - Tan-brown and/or pink-flesh-colored symmetric macules and papules - Benign appearing on exam today - Observation - Call clinic for new or changing moles - Recommend daily use of broad spectrum spf 30+ sunscreen to sun-exposed areas.   SEBORRHEIC DERMATITIS Exam: diffuse mild scaling of scalp Chronic and persistent condition with duration or expected duration over one year. Condition is symptomatic/ bothersome to patient. Not currently at goal.  Seborrheic Dermatitis is a chronic persistent rash characterized by pinkness and scaling most commonly of the mid face but also can occur on the scalp (dandruff), ears; mid chest, mid back and groin.  It tends to be exacerbated by stress and cooler weather.  People who have neurologic disease may experience new onset or exacerbation of existing seborrheic dermatitis.  The condition is not curable but treatable and can be controlled.  Treatment Plan: Continue Ketoconazole  shampoo apply three times per week, massage into scalp and leave in for 10 minutes before rinsing out    Rosacea Face: central facial erythematous telangiectatic patches Chronic condition with duration or expected duration over one year. Currently well-controlled.   Continue [facility] metronidazole/ivermectin/azelaic acid twice daily as needed to affected areas on the face. The patient was advised this is not covered by insurance since it is made by a compounding pharmacy. They will receive an email to check out and the medication will be mailed to their home.     Long term medication management.  Patient is using long term (months to years) prescription medication  to control their dermatologic condition.  These medications require periodic monitoring  to evaluate for efficacy and side effects and may require periodic laboratory monitoring.    Intertrigo inframammary, buttocks: not examined  Intertrigo is a chronic recurrent rash that occurs in skin fold areas that may be associated with friction; heat; moisture; yeast; fungus; and bacteria.  It is exacerbated by increased movement / activity; sweating; and higher atmospheric temperature.   Continue [facility] Iodoquinol 1%, Hydrocortisone 2.5%, Niacinamide 2% Cream once a day to affected areas for up to two weeks.  The patient was advised this is not covered by insurance since it is made by a compounding pharmacy. They will receive an email to check out and the medication will be mailed to their home.     Continue Zeasorb AF powder every day COMEDONE R mid back Acne/Milia surgery - R mid back Procedure risks and benefits were discussed with the patient and verbal consent was obtained. Following prep of the skin on the right mid back with an alcohol swab, extraction of comedones was performed with a comedone extractor. The patient tolerated the procedure well. ATOPIC DERMATITIS  Atopic dermatitis Hands xerotic pink scaly plaques bilaterally  Chronic and persistent condition with duration or expected duration over one year. Condition is bothersome/symptomatic for patient. Currently flared.  Continue  Triamcinolone  0.1% cream twice daily 5 days a week prn. , moisturize after washing hands   Atopic dermatitis (eczema) is a chronic, relapsing, pruritic condition that can significantly affect quality of life. It is often associated with allergic rhinitis and/or asthma and can require treatment with topical medications, phototherapy, or in severe cases biologic injectable medication (Dupixent; Adbry) or Oral JAK inhibitors.   Topical steroids (such as triamcinolone , fluocinolone, fluocinonide, mometasone, clobetasol, halobetasol, betamethasone , hydrocortisone) can cause thinning and lightening of the skin if they are used for too long in the same area. Your physician has selected  the right strength medicine for your problem and area affected on the body. Please use your medication only as directed by your physician to prevent side effects.  Related Medications triamcinolone  cream (KENALOG ) 0.1 % Apply twice daily 5 days a week until clear. Avoid applying to face, groin, and axilla. Use as directed. Long-term use can cause thinning of the skin. 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 Discussed laser hair removal. May schedule with Dr. Bary Likes or Dr. Annette Barters.  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 - skin colored papule on R upper cutaneous lip and nasal dorsum - Benign-appearing - Observe. Call for changes.  Return in about 1 year (around 06/11/2025) for TBSE - medication refills.  Arlinda Lais, CMA, am acting as scribe for Harris Liming, MD .   Documentation: I have reviewed the above documentation for accuracy and completeness, and I agree with the above.  Harris Liming, MD

## 2024-08-14 ENCOUNTER — Ambulatory Visit: Payer: 59 | Admitting: Dermatology

## 2024-09-14 ENCOUNTER — Ambulatory Visit: Admission: RE | Admit: 2024-09-14 | Discharge: 2024-09-14 | Disposition: A | Discharge status: Home / Self Care

## 2024-09-14 VITALS — BP 126/83 | HR 107 | Temp 98.1°F | Resp 19

## 2024-09-14 DIAGNOSIS — J069 Acute upper respiratory infection, unspecified: Secondary | ICD-10-CM

## 2024-09-14 DIAGNOSIS — J4541 Moderate persistent asthma with (acute) exacerbation: Secondary | ICD-10-CM

## 2024-09-14 LAB — POC SOFIA SARS ANTIGEN FIA: SARS Coronavirus 2 Ag: NEGATIVE

## 2024-09-14 MED ORDER — PREDNISONE 10 MG PO TABS: 40.0000 mg | ORAL_TABLET | Freq: Every day | ORAL | 0 refills | Status: AC

## 2024-09-14 NOTE — ED Provider Notes (Signed)
 Pamela Dillon    CSN: [id number] Arrival date & time: 09/14/24  1415      History   Chief Complaint Chief Complaint  Patient presents with   Nasal Congestion    Nasal congestion and wheezing. I have asthma and not sure if this is allergies or a cold. Also post nasal drip and sore throat - Entered by patient    HPI Pamela Dillon is a 38 y.o. female.  Patient presents with 3-day history of chills, body aches, congestion, sore throat, cough, wheezing.  She last used her albuterol  inhaler yesterday.  She also has been taking Singulair and Flonase.  No fever or shortness of breath.  Her medical history includes asthma.  The history is provided by the patient and medical records.    Past Medical History:  Diagnosis Date   Allergy     Asthma    Interstitial cystitis    Seizures (HCC)     Patient Active Problem List   Diagnosis Date Noted   Egg allergy  12/13/2019   GBS bacteriuria 07/24/2019   Focal nodular hyperplasia of liver 11/27/2018   Impingement syndrome of shoulder region 05/26/2018   Chronic seasonal allergic rhinitis due to fungal spores 10/11/2016   Extrinsic asthma 05/15/2015   Dyspnea 05/15/2015   Interstitial cystitis (chronic) without hematuria 05/13/2009    Past Surgical History:  Procedure Laterality Date   LAPAROSCOPY  2010   CYSTICS.   URETEROSCOPY  2010   WISDOM TOOTH EXTRACTION     X 3    OB History     Gravida  1   Para  0   Term  0   Preterm  0   AB  0   Living  0      SAB  0   IAB  0   Ectopic  0   Multiple  0   Live Births               Home Medications    Prior to Admission medications   Medication Sig Start Date End Date Taking? Authorizing Provider  albuterol  (PROAIR  HFA) 108 (90 BASE) MCG/ACT inhaler Inhale 2 puffs into the lungs every 4 (four) hours as needed for wheezing or shortness of breath. 10/03/15  Yes McQuaid, Douglas B, MD  budesonide-formoterol (SYMBICORT) 80-4.5 MCG/ACT inhaler  Inhale 80 mcg into the lungs 2 (two) times daily. 03/17/15  Yes [provider]  fluticasone (FLONASE) 50 MCG/ACT nasal spray fluticasone propionate 50 mcg/actuation nasal spray,suspension 01/02/18  Yes [provider]  montelukast (SINGULAIR) 10 MG tablet TAKE 1 TABLET BY MOUTH EVERY DAY EVERY NIGHT 10/30/18  Yes [provider]  predniSONE  (DELTASONE ) 10 MG tablet Take 4 tablets (40 mg total) by mouth daily for 5 days. 09/14/24 09/19/24 Yes Corlis Burnard DEL, NP  Acetaminophen 500 MG capsule Take by mouth.    [provider]  Azelaic Acid  15 % gel Apply to face QD. 06/11/24   Smith, Collin-Jamal, MD  Eflornithine HCl 13.9 % cream Apply to aa BID. 06/11/24   Claudene Lehmann, MD  hydrocortisone  2.5 % cream Apply topically 2 (two) times daily as needed (Rash). 06/11/24   Claudene Lehmann, MD  ketoconazole  (NIZORAL ) 2 % shampoo apply three times per week, massage into scalp and leave in for 10 minutes before rinsing out 06/11/24   Claudene Lehmann, MD  LORazepam (ATIVAN) 0.5 MG tablet Take by mouth. 11/25/20   [provider]  meloxicam  (MOBIC ) 15 MG tablet TAKE 1 TABLET (  15 MG TOTAL) BY MOUTH DAILY. 01/10/22   Janit Thresa HERO, DPM  metFORMIN (GLUCOPHAGE) 500 MG tablet Take 500 mg by mouth 2 (two) times daily.    [provider]  methocarbamol (ROBAXIN) 500 MG tablet Take 1 tablet by mouth 3 (three) times daily. 11/28/20   [provider]  phentermine (ADIPEX-P) 37.5 MG tablet Take by mouth. 06/22/23 07/22/23  [provider]  Spacer/Aero-Holding Chambers (AEROCHAMBER MV) inhaler Use as instructed 05/29/15   Alaine Vicenta NOVAK, MD  triamcinolone  cream (KENALOG ) 0.1 % Apply twice daily 5 days a week until clear. Avoid applying to face, groin, and axilla. Use as directed. Long-term use can cause thinning of the skin. 06/11/24   Claudene Lehmann, MD    Family History Family History  Problem Relation Age of Onset   Multiple sclerosis  Mother    Cancer Maternal Grandmother        BREAST.   Cancer Maternal Grandfather        THYRIOD.   Cancer Paternal Grandmother        LUNG   Stroke Paternal Grandfather    Heart disease Paternal Grandfather        HEART ATTACK.    Social History Social History   Tobacco Use   Smoking status: Never   Smokeless tobacco: Never  Vaping Use   Vaping status: Never Used  Substance Use Topics   Alcohol use: Yes    Alcohol/week: 0.0 standard drinks of alcohol    Comment: occassionally   Drug use: No     Allergies   Demerol [meperidine], Meperidine hcl, Diphenhydramine, Doxycycline , and Sulfa antibiotics   Review of Systems Review of Systems  Constitutional:  Positive for chills. Negative for fever.  HENT:  Positive for congestion, postnasal drip, rhinorrhea and sore throat. Negative for ear pain.   Respiratory:  Positive for cough and wheezing. Negative for shortness of breath.      Physical Exam Triage Vital Signs ED Triage Vitals  Encounter Vitals Group     BP 09/14/24 1434 126/83     Girls Systolic BP Percentile --      Girls Diastolic BP Percentile --      Boys Systolic BP Percentile --      Boys Diastolic BP Percentile --      Pulse Rate 09/14/24 1434 (!) 107     Resp 09/14/24 1434 19     Temp 09/14/24 1434 98.1 F (36.7 C)     Temp src --      SpO2 09/14/24 1434 98 %     Weight --      Height --      Head Circumference --      Peak Flow --      Pain Score 09/14/24 1432 3     Pain Loc --      Pain Education --      Exclude from Growth Chart --    No data found.  Updated Vital Signs BP 126/83   Pulse (!) 107   Temp 98.1 F (36.7 C)   Resp 19   LMP 09/02/2024   SpO2 98%   Visual Acuity Right Eye Distance:   Left Eye Distance:   Bilateral Distance:    Right Eye Near:   Left Eye Near:    Bilateral Near:     Physical Exam Constitutional:      General: She is not in acute distress. HENT:     Right Ear: Tympanic membrane normal.  Left  Ear: Tympanic membrane normal.     Nose: Congestion and rhinorrhea present.     Mouth/Throat:     Mouth: Mucous membranes are moist.     Pharynx: Oropharynx is clear.  Cardiovascular:     Rate and Rhythm: Normal rate and regular rhythm.     Heart sounds: Normal heart sounds.  Pulmonary:     Effort: Pulmonary effort is normal. No respiratory distress.     Breath sounds: Normal breath sounds. No wheezing.  Neurological:     Mental Status: She is alert.      UC Treatments / Results  Labs (all labs ordered are listed, but only abnormal results are displayed) Labs Reviewed  POC SOFIA SARS ANTIGEN FIA - Normal    EKG   Radiology No results found.  Procedures Procedures (including critical care time)  Medications Ordered in UC Medications - No data to display  Initial Impression / Assessment and Plan / UC Course  I have reviewed the triage vital signs and the nursing notes.  Pertinent labs & imaging results that were available during my care of the patient were reviewed by me and considered in my medical decision making (see chart for details).    Viral URI, asthma exacerbation.  Lungs are currently clear and O2 sat is 98% on room air.  COVID test is negative.  Patient reports her albuterol  inhaler is in date and has plenty of activations left.  Instructed her to use her albuterol  inhaler as directed.  Treating today with prednisone  x 5 days.  Education provided on viral respiratory infection and asthma.  Instructed her to follow-up with her PCP.  ED precautions given.  She agrees to plan of care.  Final Clinical Impressions(s) / UC Diagnoses   Final diagnoses:  Viral URI  Moderate persistent asthma with acute exacerbation     Discharge Instructions      Your COVID test is negative.    Use your albuterol  inhaler as directed.  Take the prednisone  as directed.    Follow up with your primary care provider.  Go to the emergency department if you have worsening  symptoms.        ED Prescriptions     Medication Sig Dispense Auth. Provider   predniSONE  (DELTASONE ) 10 MG tablet Take 4 tablets (40 mg total) by mouth daily for 5 days. 20 tablet Corlis Burnard DEL, NP      PDMP not reviewed this encounter.   Corlis Burnard DEL, NP 09/14/24 1459

## 2024-09-14 NOTE — ED Triage Notes (Signed)
 Patient to Urgent Care with complaints of nasal congestion/ body aches/ chills/ sore throat/ wheezing/ chest congestion/ denies any fevers.  Symptoms x3 days.   Using albuterol  inhaler/ vitamins (c/d/elderberry) flonase/ Singulair.

## 2024-09-14 NOTE — Discharge Instructions (Addendum)
 Your COVID test is negative.    Use your albuterol  inhaler as directed.  Take the prednisone  as directed.    Follow up with your primary care provider.  Go to the emergency department if you have worsening symptoms.

## 2025-06-11 ENCOUNTER — Ambulatory Visit: Admitting: Dermatology
